# Patient Record
Sex: Female | Born: 1945 | Race: White | Hispanic: No | Marital: Married | Smoking: Never smoker
Health system: Southern US, Community
[De-identification: ages and names within clinical notes are randomized; demographics above are authoritative.]

## PROBLEM LIST (undated history)

## (undated) DIAGNOSIS — J309 Allergic rhinitis, unspecified: Secondary | ICD-10-CM

## (undated) DIAGNOSIS — G4730 Sleep apnea, unspecified: Secondary | ICD-10-CM

## (undated) DIAGNOSIS — J45909 Unspecified asthma, uncomplicated: Secondary | ICD-10-CM

## (undated) DIAGNOSIS — I10 Essential (primary) hypertension: Secondary | ICD-10-CM

## (undated) HISTORY — DX: Allergic rhinitis, unspecified: J30.9

## (undated) HISTORY — DX: Sleep apnea, unspecified: G47.30

## (undated) HISTORY — DX: Unspecified asthma, uncomplicated: J45.909

## (undated) HISTORY — PX: BREAST EXCISIONAL BIOPSY: SUR124

## (undated) HISTORY — DX: Essential (primary) hypertension: I10

## (undated) HISTORY — PX: BREAST SURGERY: SHX581

---

## 1999-04-25 ENCOUNTER — Encounter: Payer: Self-pay | Admitting: *Deleted

## 1999-04-25 ENCOUNTER — Ambulatory Visit (HOSPITAL_COMMUNITY): Admission: RE | Admit: 1999-04-25 | Discharge: 1999-04-25 | Payer: Self-pay | Admitting: *Deleted

## 1999-04-28 ENCOUNTER — Ambulatory Visit (HOSPITAL_COMMUNITY): Admission: RE | Admit: 1999-04-28 | Discharge: 1999-04-28 | Payer: Self-pay | Admitting: *Deleted

## 1999-04-28 ENCOUNTER — Encounter: Payer: Self-pay | Admitting: *Deleted

## 1999-07-04 ENCOUNTER — Emergency Department (HOSPITAL_COMMUNITY): Admission: EM | Admit: 1999-07-04 | Discharge: 1999-07-04 | Payer: Self-pay | Admitting: Emergency Medicine

## 1999-07-04 ENCOUNTER — Encounter: Payer: Self-pay | Admitting: Emergency Medicine

## 1999-07-19 ENCOUNTER — Encounter: Admission: RE | Admit: 1999-07-19 | Discharge: 1999-08-04 | Payer: Self-pay | Admitting: *Deleted

## 1999-09-13 ENCOUNTER — Other Ambulatory Visit: Admission: RE | Admit: 1999-09-13 | Discharge: 1999-09-13 | Payer: Self-pay | Admitting: *Deleted

## 2000-07-01 ENCOUNTER — Encounter: Payer: Self-pay | Admitting: *Deleted

## 2000-07-01 ENCOUNTER — Ambulatory Visit (HOSPITAL_COMMUNITY): Admission: RE | Admit: 2000-07-01 | Discharge: 2000-07-01 | Payer: Self-pay | Admitting: *Deleted

## 2001-08-06 ENCOUNTER — Encounter: Payer: Self-pay | Admitting: *Deleted

## 2001-08-06 ENCOUNTER — Ambulatory Visit (HOSPITAL_COMMUNITY): Admission: RE | Admit: 2001-08-06 | Discharge: 2001-08-06 | Payer: Self-pay | Admitting: *Deleted

## 2002-02-09 ENCOUNTER — Other Ambulatory Visit: Admission: RE | Admit: 2002-02-09 | Discharge: 2002-02-09 | Payer: Self-pay | Admitting: *Deleted

## 2002-04-08 ENCOUNTER — Ambulatory Visit (HOSPITAL_COMMUNITY): Admission: RE | Admit: 2002-04-08 | Discharge: 2002-04-08 | Payer: Self-pay | Admitting: *Deleted

## 2003-04-28 ENCOUNTER — Other Ambulatory Visit: Admission: RE | Admit: 2003-04-28 | Discharge: 2003-04-28 | Payer: Self-pay | Admitting: *Deleted

## 2003-07-08 ENCOUNTER — Ambulatory Visit (HOSPITAL_COMMUNITY): Admission: RE | Admit: 2003-07-08 | Discharge: 2003-07-08 | Payer: Self-pay | Admitting: *Deleted

## 2004-05-29 ENCOUNTER — Other Ambulatory Visit: Admission: RE | Admit: 2004-05-29 | Discharge: 2004-05-29 | Payer: Self-pay | Admitting: *Deleted

## 2004-07-17 ENCOUNTER — Ambulatory Visit (HOSPITAL_COMMUNITY): Admission: RE | Admit: 2004-07-17 | Discharge: 2004-07-17 | Payer: Self-pay | Admitting: *Deleted

## 2005-04-12 ENCOUNTER — Encounter: Admission: RE | Admit: 2005-04-12 | Discharge: 2005-04-12 | Payer: Self-pay | Admitting: *Deleted

## 2005-04-18 ENCOUNTER — Ambulatory Visit (HOSPITAL_COMMUNITY): Admission: RE | Admit: 2005-04-18 | Discharge: 2005-04-18 | Payer: Self-pay | Admitting: General Surgery

## 2005-04-18 ENCOUNTER — Encounter (INDEPENDENT_AMBULATORY_CARE_PROVIDER_SITE_OTHER): Payer: Self-pay | Admitting: Specialist

## 2006-04-16 ENCOUNTER — Ambulatory Visit (HOSPITAL_COMMUNITY): Admission: RE | Admit: 2006-04-16 | Discharge: 2006-04-16 | Payer: Self-pay | Admitting: Obstetrics and Gynecology

## 2006-04-26 ENCOUNTER — Encounter: Admission: RE | Admit: 2006-04-26 | Discharge: 2006-04-26 | Payer: Self-pay | Admitting: Obstetrics and Gynecology

## 2006-07-02 ENCOUNTER — Other Ambulatory Visit: Admission: RE | Admit: 2006-07-02 | Discharge: 2006-07-02 | Payer: Self-pay | Admitting: Obstetrics & Gynecology

## 2006-10-15 ENCOUNTER — Encounter: Admission: RE | Admit: 2006-10-15 | Discharge: 2006-10-15 | Payer: Self-pay | Admitting: Obstetrics & Gynecology

## 2006-12-10 ENCOUNTER — Ambulatory Visit (HOSPITAL_BASED_OUTPATIENT_CLINIC_OR_DEPARTMENT_OTHER): Admission: RE | Admit: 2006-12-10 | Discharge: 2006-12-10 | Payer: Self-pay | Admitting: Otolaryngology

## 2006-12-10 ENCOUNTER — Encounter: Payer: Self-pay | Admitting: Pulmonary Disease

## 2006-12-15 ENCOUNTER — Ambulatory Visit: Payer: Self-pay | Admitting: Internal Medicine

## 2007-01-03 ENCOUNTER — Other Ambulatory Visit: Admission: RE | Admit: 2007-01-03 | Discharge: 2007-01-03 | Payer: Self-pay | Admitting: Obstetrics & Gynecology

## 2007-03-13 ENCOUNTER — Ambulatory Visit (HOSPITAL_COMMUNITY): Admission: RE | Admit: 2007-03-13 | Discharge: 2007-03-14 | Payer: Self-pay | Admitting: Otolaryngology

## 2007-03-13 ENCOUNTER — Encounter (INDEPENDENT_AMBULATORY_CARE_PROVIDER_SITE_OTHER): Payer: Self-pay | Admitting: Otolaryngology

## 2007-05-08 ENCOUNTER — Encounter: Admission: RE | Admit: 2007-05-08 | Discharge: 2007-05-08 | Payer: Self-pay | Admitting: Obstetrics & Gynecology

## 2007-06-16 ENCOUNTER — Ambulatory Visit: Payer: Self-pay | Admitting: Pulmonary Disease

## 2007-07-04 ENCOUNTER — Other Ambulatory Visit: Admission: RE | Admit: 2007-07-04 | Discharge: 2007-07-04 | Payer: Self-pay | Admitting: Obstetrics & Gynecology

## 2007-07-14 ENCOUNTER — Ambulatory Visit: Payer: Self-pay | Admitting: Pulmonary Disease

## 2007-08-04 ENCOUNTER — Encounter: Payer: Self-pay | Admitting: Pulmonary Disease

## 2008-03-01 DIAGNOSIS — J302 Other seasonal allergic rhinitis: Secondary | ICD-10-CM | POA: Insufficient documentation

## 2008-03-01 DIAGNOSIS — I10 Essential (primary) hypertension: Secondary | ICD-10-CM | POA: Insufficient documentation

## 2008-03-01 DIAGNOSIS — J3089 Other allergic rhinitis: Secondary | ICD-10-CM

## 2008-07-08 ENCOUNTER — Other Ambulatory Visit: Admission: RE | Admit: 2008-07-08 | Discharge: 2008-07-08 | Payer: Self-pay | Admitting: Obstetrics & Gynecology

## 2008-09-01 ENCOUNTER — Encounter: Admission: RE | Admit: 2008-09-01 | Discharge: 2008-09-01 | Payer: Self-pay | Admitting: Obstetrics & Gynecology

## 2008-09-13 ENCOUNTER — Ambulatory Visit: Payer: Self-pay | Admitting: Pulmonary Disease

## 2008-09-13 DIAGNOSIS — G4733 Obstructive sleep apnea (adult) (pediatric): Secondary | ICD-10-CM

## 2008-09-17 ENCOUNTER — Telehealth: Payer: Self-pay | Admitting: Pulmonary Disease

## 2009-09-08 ENCOUNTER — Encounter: Admission: RE | Admit: 2009-09-08 | Discharge: 2009-09-08 | Payer: Self-pay | Admitting: Obstetrics & Gynecology

## 2010-09-13 ENCOUNTER — Encounter
Admission: RE | Admit: 2010-09-13 | Discharge: 2010-09-13 | Payer: Self-pay | Source: Home / Self Care | Attending: Obstetrics & Gynecology | Admitting: Obstetrics & Gynecology

## 2010-09-17 ENCOUNTER — Encounter: Payer: Self-pay | Admitting: Obstetrics and Gynecology

## 2010-09-17 ENCOUNTER — Encounter: Payer: Self-pay | Admitting: Obstetrics & Gynecology

## 2011-01-09 NOTE — H&P (Signed)
NAME:  GETHSEMANE, FISCHLER NO.:  [id number]   MEDICAL RECORD NO.:  [id number]          PATIENT TYPE:  OIB   LOCATION:  5733                         FACILITY:  [facility]   PHYSICIAN:  Hermelinda Medicus, M.D.   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  She has gained some weight.  She weighs slightly over 200  pounds and she has had a sleep study which has shown her oxygen level,  her O2 nadir, to drop to 75%.  She started as a normal saturation of 93%  on room air.  She also has an RDI of 82.1.  She weighs 214 pounds.  BMI  of 37.8.  She is also an allergy patient.  Her allergies are moderately  under control and she has a normal chest x-ray.  She also has had some  sinus issues, where she has had a CAT scan of her sinuses which showed  the sinuses to just show a little bit of mucous membrane thickening, but  the septal deviation was severe with severe spurring blocking her nose.  At this point it was elected to do just a septal reconstruction,  turbinate reduction.  She may try a dental prosthesis, she may also try  CPAP; but neither one of these would work well until the septum is  straightened so that she can get possibility for CPAP.  She now enters  for a septal reconstruction and turbinate reduction.   PAST MEDICAL HISTORY:  Her past history is one of blood pressure  elevation, but has been relatively quite normal.  She more recently has  had a blood pressure of 143/92, however; but she has been taking  decongestants, Maxifed-G and also a half of an Allegra D12.  The Maxifed-  G is in the a.m.  She also takes hydrochlorothiazide 25 mg; Allegra D,  as I said, one-half at bedtime; Prometrium 100 mg; multivitamins;  calcium citrate; Slow  Iron; vitamin C; glucosamine XS; and fish oil.  She has no allergies to medications, and otherwise she is in good  health.  She does work with MRSA patients but has been cultured and  found to be free of any abnormal bacteria.  Her EKG was also normal, and  her lab work was also within normal limits.   PHYSICAL EXAMINATION:  GENERAL APPEARANCE:  Her physical examination  reveals a well-nourished, well-developed, mildly overweight female.  VITAL SIGNS:  Blood pressure of 143/92, the pulse is 78.  EARS:  The ears are clear.  The tympanic membranes are clear.  MOUTH:  The  oral cavity is clear, fairly small, and with a low palate.  NOSE:  The septum is grossly deviated to the left, in the columella, in  the vomerine spur and the ethmoid region actually swings somewhat in the  high ethmoid back to the right.  Her turbinates are also  hypertrophied.  She shows no polyps, however.  THROAT:  The larynx has clear true cords, false cords, epiglottal space  and tongue are clear of ulceration or mass.  True cord mobility, gag  reflex, tongue mobility, EOMs and facial nerve are all symmetrical.  NECK:  The neck is free of any thyromegaly, cervical adenopathy or mass.  No salivary gland abnormalities.   INITIAL DIAGNOSIS:  The initial diagnosis is that of sleep apnea with  septal deviation with turbinate hypertrophy with a history of blood  pressure elevation with history of moderate obesity.           ______________________________  Hermelinda Medicus, M.D.     JC/MEDQ  D:  03/13/2007  T:  03/13/2007  Job:  [id number]   cc:   Chales Salmon. Abigail Miyamoto, M.D.

## 2011-01-09 NOTE — Op Note (Signed)
NAME:  [name] Finley, [name] Finley            ACCOUNT NO.:  [id number]   MEDICAL RECORD NO.:  [id number]          PATIENT TYPE:  OIB   LOCATION:  5733                         FACILITY:  [facility]   PHYSICIAN:  Hermelinda Medicus, M.D.   DATE OF BIRTH:  12-25-1945   DATE OF PROCEDURE:  DATE OF DISCHARGE:                               OPERATIVE REPORT   PREOPERATIVE DIAGNOSES:  1. Septal deviation.  2. Turbinate hypertrophy.  3. Nasal obstruction.  4. History of sleep apnea.   POSTOPERATIVE DIAGNOSES:  1. Septal deviation.  2. Turbinate hypertrophy.  3. Nasal obstruction.  4. History of sleep apnea.   OPERATION:  Septal reconstruction, turbinate reduction.   OPERATOR:  Hermelinda Medicus, M.D.   ANESTHESIA:  Local monitored anesthesia care.   PROCEDURE:  The patient was placed in the supine position.  Under local  MAC anesthesia she was anesthetized with 1% Xylocaine with epinephrine  and topical cocaine 200 mg.  The 1% Xylocaine with epinephrine was using  8 mL and she is aware of the risks and gains, aware that she will  possibly need CPAP after this as she does have sleep apnea and we will  be keeping her overnight for 24-hour observation and using anesthesia  trumpets postoperatively to guarantee her airway because of her sleep  apnea history.  She is aware of the risks of infection and of any  bleeding and is to be very careful and not travel over the next few  days.   Once the anesthesia was instilled, a hemitransfixion incision was made  on the right side carried around the columella to the left side and the  mucoperichondrium was elevated along that side back along the  quadrilateral cartilage, and the floor of the nose was just filled with  the deviated quadrilateral cartilage off to the premaxillary crest.  This was taken using the freer elevator and then we worked back towards  the vomerine which was similarly taken to the left and we took a very  large spur off the floor of the nose  as well as the vomerine septum  using the open and closed Jansen-Middletons and the 4-mm chisel and  worked our away right back to the anterior wall of the sphenoid sinus.  Then our attention was taken to the ethmoid which swung to the right  causing some blockage on that side and using the open and closed Leane Para-  Middletons and the Augusta forceps we removed a small amount of  ethmoid septal deviation that was off to the right.  The columella was  reestablished on the midline and once this was completed, the inferior  turbinates were all fractured, the middle turbinates were infractured  and they were concha bullosa also on the right side which was corrected  using the polyp forceps, but no mucous membrane was removed.  The AllMed  was then set at 12 and this was used to coagulate or cauterize the  inferior lateral aspect of the turbinates.  Once this was completed,  Telfa was placed within the nose on each side and anesthesia trumpet #7  was placed on the left  side to #1 ensure her airway, and #2 add extra  pressure to push that septum towards the right side of the nose.   Her follow up will be in the hospital overnight because of her sleep  apnea history and then will be in 3 days, then in 2 weeks, 4 weeks, 6  weeks, 3 months, and 6 months.  She may have a sleep study repeated.  She may used just a dental appliance to try to help her sleep apnea if  this septal surgery does not resolve it adequately or we may go to CPAP  which then would be an option. It is not an option before this severe  septal deviation was corrected.           ______________________________  Hermelinda Medicus, M.D.     JC/MEDQ  D:  03/13/2007  T:  03/13/2007  Job:  [id number]   cc:   Chales Salmon. Abigail Miyamoto, M.D.

## 2011-01-09 NOTE — Assessment & Plan Note (Signed)
[facility]ALTHCARE                             PULMONARY OFFICE NOTE   NAME:FLOTKOETTERGrainne, [name]                   MRN:          [id number]  DATE:06/16/2007                            DOB:          03-11-1946    HISTORY OF PRESENT ILLNESS:  The patient is a 65 year old female who I  have been asked to see for medical management of obstructive sleep  apnea.  The patient was diagnosed with severe obstructive sleep apnea in  April of 2008 where she was found to have an apnea/hypopnea index of 82  events per hour, and O2 desaturation as low as 75%.  The patient was  scheduled for nasal septal reconstruction with UP3 by Dr. Haroldine Laws,  however, there were some insurance issues, and she just had the nasal  septal reconstruction that would help her be able to tolerate CPAP  better.  The patient states that she typically gets to bed between 1 and  2 a.m. after working a 3 to 11 shift.  She gets up between 8 and 8:30 to  start her day.  She has been noted to have loud snoring, but no one has  ever mentioned pauses in her breathing during sleep.  She is not rested  in the mornings whenever she arises, and does have chronic a.m.  headaches.  The patient works as a Engineer, civil (consulting) at [employer], and  feels tired all of the time.  She has sleep pressure with inactivity,  and also decreased alertness, efficiency, and concentration.  She has  had dozing issues with TV or movies in the evenings.  She has no  difficulties driving short distances, but does not trust herself to  drive long distances because of sleepiness.  Her weight has increased  about 15 pounds over the last few years.   PAST MEDICAL HISTORY:  1. Hypertension.  2. Allergic rhinitis.  3. History of sleep apnea as stated above.  4. History of nasal septal reconstruction.   CURRENT MEDICATIONS:  1. Allegra D 1/2 q.a.m.  2. Hydrochlorothiazide 25 mg daily.  3. Glucosamine 1500 mg daily.  4. Multivitamin  daily.  5. Ambien 5 mg nightly p.r.n.  6. Over-the-counter vitamins and herbal preparations.   The patient has no known drug allergies.   SOCIAL HISTORY:  She is married.  She has never smoked.   FAMILY HISTORY:  Remarkable for her mother having polymyalgia  rheumatica, otherwise unremarkable.   REVIEW OF SYSTEMS:  As per history of present illness.  Also, see the  patient intake form documented in the chart.   PHYSICAL EXAMINATION:  IN GENERAL:  She is an obese female in no acute  distress.  Blood pressure is 122/82, pulse 67, temperature is 97.8.  Weight is 199  pounds.  She is 5 feet 3 inches tall.  O2 saturation on room air is 98%.  HEENT:  Pupils are equal, round, and reactive to light and  accommodation, extraocular muscles are intact.  Nares show mild septal  deviation to the left.  Oropharynx shows moderate elongation of the soft  palate and uvula.  NECK:  Supple without JVD or lymphadenopathy.  There is no palpable  thyromegaly.  CHEST:  Totally clear.  CARDIAC EXAM:  Reveals regular rate and rhythm, no murmurs, rubs, or  gallops.  ABDOMEN:  Soft, nontender, with good bowel sounds.  GENITAL/RECTAL/BREAST EXAM:  Not done and not indicated.  LOWER EXTREMITIES:  Without edema.  Pulses are intact distally.  Neurologically alert and oriented with no obvious observable motor  defects.   IMPRESSION:  History of severe obstructive sleep apnea, documented by  nocturnal polysomnography.  The patient clearly is very symptomatic  during the day, and it has had a significant impact on her quality of  life.  I really think at this point in time continuous positive airway  pressure coupled with weight loss would be her best treatment option.  The patient is agreeable to giving this a try.   PLAN:  1. Initiate continuous positive airway pressure at 10 cm.  2. Work on weight loss.  3. The patient will follow up in 4 weeks or sooner if there are      problems.  At that point in  time, we can work on pressure      optimization.     Barbaraann Share, MD,FCCP  Electronically Signed    [facility]/MedQ  DD: 06/16/2007  DT: 06/17/2007  Job #: [id number]   cc:   Hermelinda Medicus, M.D.  Chales Salmon. Abigail Miyamoto, M.D.

## 2011-01-12 NOTE — Op Note (Signed)
NAME:  [name] Finley, [name] Finley            ACCOUNT NO.:  [id number]   MEDICAL RECORD NO.:  [id number]          PATIENT TYPE:  AMB   LOCATION:  DAY                          FACILITY:  [facility]   PHYSICIAN:  Ollen Gross. Vernell Morgans, M.D. DATE OF BIRTH:  1946-07-28   DATE OF PROCEDURE:  04/18/2005  DATE OF DISCHARGE:                                 OPERATIVE REPORT   PREOPERATIVE DIAGNOSIS:  Left breast mass.   POSTOPERATIVE DIAGNOSIS:  Left breast mass.   PROCEDURE:  Left breast biopsy.   SURGEON:  Ollen Gross. Carolynne Edouard, M.D.   ANESTHESIA:  General via LMA.   PROCEDURE:  After informed consent was obtained, the patient was brought to  the operating room and placed in the supine positron on the operating room  table.  After adequate induction of general anesthesia, the patient's left  breast was prepped with Betadine and draped in the usual sterile manner.  The mass in question was palpable at 12 o'clock in the left breast.  A small  curvilinear incision was made with a 15 blade knife overlying the incision.  This incision was carried down through the skin and into the subcutaneous  fatty tissue of the breast sharply with the electrocautery.  The mass was  then grasped with Allis clamp, and dissection was carried out sharply with  the electrocautery around the mass until the mass was completely removed  from the breast.  Once the mass was removed, it was oriented with a short  stitch superiorly and long stitch laterally, and double stitch in the skin.  The cavity was palpated, and no other masses were appreciated.  Hemostasis  was achieved using the Bovie electrocautery.  The wound was then infiltrated  with 0.25% Marcaine with epinephrine.  The wound was irrigated with copious  amounts of saline, and the skin was closed with interrupted 4-0 Monocryl  subcuticular stitches.  Benzoin, Steri-Strips, and sterile dressings were  applied.  The patient tolerated the procedure well.  At the end of the case,  all  needle, sponge, and instrument counts were correct.  The patient was  then awakened and taken to the recovery room in stable condition.      Ollen Gross. Vernell Morgans, M.D.  Electronically Signed     PST/MEDQ  D:  04/18/2005  T:  04/18/2005  Job:  [id number]

## 2011-01-12 NOTE — Procedures (Signed)
NAME:  [name] Finley, [name] Finley            ACCOUNT NO.:  [id number]   MEDICAL RECORD NO.:  [id number]          PATIENT TYPE:  OUT   LOCATION:  SLEEP CENTER                 FACILITY:  [facility]   PHYSICIAN:  Clinton D. Maple Hudson, MD, FCCP, FACPDATE OF BIRTH:  [birth year]   DATE OF STUDY:  12/10/2006                            NOCTURNAL POLYSOMNOGRAM   REFERRING PHYSICIAN:  Hermelinda Medicus, M.D.   INDICATION FOR STUDY:  Hypersomnia with sleep apnea.   EPWORTH SLEEPINESS SCORE:  19/24. BMI 37.8, weight 214 pounds.   MEDICATIONS:  Home medications are listed and reviewed.   SLEEP ARCHITECTURE:  Total sleep time 328 minutes with sleep efficiency  88%.  Stage I was 5%, stage II 65%, stages III and IV 13%. REM 16% of  total sleep time. Sleep latency 4 minutes, REM latency 135 minutes,  awake after sleep onset 41 minutes, arousal index 21.4.  Ambien 5 mg was  taken at 10:50 p.m.   RESPIRATORY DATA:  NPSG diagnostic protocol as ordered.  Apnea/hypopnea  index (AHI, RDI) 82.1 obstructive events per hour indicating severe  obstructive sleep apnea/hypopnea syndrome.  There were 348 obstructive  apnea's, 101 hypopnea's.  Events were not positional. REM AHI 104 per  hour.   OXYGEN DATA:  Loud snoring in all positions with oxygen desaturation to  a nadir of 75%.  Mean oxygen saturation through the study was 93% on  room air.   CARDIAC DATA:  Normal sinus rhythm.   MOVEMENT-PARASOMNIA:  Occasional limb jerk with little effect on sleep.   IMPRESSIONS-RECOMMENDATIONS:  1. Severe obstructive sleep apnea/hypopnea syndrome, AHI 82.1 per hour      with non positional events, loud snoring and oxygen desaturation to      75%.  2. The patient reported difficulty breathing through her nose.   Consider return for CPAP titration or evaluate for alternative therapies  as appropriate.      Clinton D. Maple Hudson, MD, [facility], FACP  Diplomate, Biomedical engineer of Sleep Medicine  Electronically Signed     CDY/MEDQ  D:   12/15/2006 13:54:38  T:  12/15/2006 18:40:21  Job:  [id number]

## 2011-06-11 LAB — URINE MICROSCOPIC-ADD ON

## 2011-06-11 LAB — CBC
HCT: 38.8
Hemoglobin: 13.3
MCHC: 34.2
MCV: 91.7
Platelets: 287
RBC: 4.23
RDW: 14
WBC: 8.8

## 2011-06-11 LAB — URINALYSIS, ROUTINE W REFLEX MICROSCOPIC
Bilirubin Urine: NEGATIVE
Glucose, UA: NEGATIVE
Ketones, ur: NEGATIVE
Nitrite: NEGATIVE
Protein, ur: NEGATIVE
Specific Gravity, Urine: 1.016
Urobilinogen, UA: 0.2
pH: 6

## 2011-06-11 LAB — BASIC METABOLIC PANEL WITH GFR
BUN: 13
CO2: 28
Calcium: 9.5
GFR calc Af Amer: >60
GFR calc non Af Amer: >60
Potassium: 3.9

## 2011-06-11 LAB — BASIC METABOLIC PANEL
Chloride: 106
Creatinine, Ser: 0.68
Glucose, Bld: 102 — ABNORMAL HIGH
Sodium: 141

## 2011-06-13 ENCOUNTER — Telehealth: Payer: Self-pay | Admitting: Pulmonary Disease

## 2011-06-13 NOTE — Telephone Encounter (Signed)
[id number] x1 

## 2011-06-14 NOTE — Telephone Encounter (Signed)
Spoke with pt. She states that she has already had her nickel testing done and nothing further needed.

## 2011-09-05 DIAGNOSIS — R262 Difficulty in walking, not elsewhere classified: Secondary | ICD-10-CM | POA: Diagnosis not present

## 2011-09-05 DIAGNOSIS — M6281 Muscle weakness (generalized): Secondary | ICD-10-CM | POA: Diagnosis not present

## 2011-09-05 DIAGNOSIS — IMO0001 Reserved for inherently not codable concepts without codable children: Secondary | ICD-10-CM | POA: Diagnosis not present

## 2011-09-11 DIAGNOSIS — R262 Difficulty in walking, not elsewhere classified: Secondary | ICD-10-CM | POA: Diagnosis not present

## 2011-09-11 DIAGNOSIS — M6281 Muscle weakness (generalized): Secondary | ICD-10-CM | POA: Diagnosis not present

## 2011-09-11 DIAGNOSIS — IMO0001 Reserved for inherently not codable concepts without codable children: Secondary | ICD-10-CM | POA: Diagnosis not present

## 2011-09-12 ENCOUNTER — Other Ambulatory Visit: Payer: Self-pay | Admitting: Obstetrics & Gynecology

## 2011-09-12 DIAGNOSIS — Z1231 Encounter for screening mammogram for malignant neoplasm of breast: Secondary | ICD-10-CM

## 2011-09-13 DIAGNOSIS — IMO0001 Reserved for inherently not codable concepts without codable children: Secondary | ICD-10-CM | POA: Diagnosis not present

## 2011-09-13 DIAGNOSIS — R262 Difficulty in walking, not elsewhere classified: Secondary | ICD-10-CM | POA: Diagnosis not present

## 2011-09-13 DIAGNOSIS — M6281 Muscle weakness (generalized): Secondary | ICD-10-CM | POA: Diagnosis not present

## 2011-09-18 DIAGNOSIS — M6281 Muscle weakness (generalized): Secondary | ICD-10-CM | POA: Diagnosis not present

## 2011-09-18 DIAGNOSIS — IMO0001 Reserved for inherently not codable concepts without codable children: Secondary | ICD-10-CM | POA: Diagnosis not present

## 2011-09-18 DIAGNOSIS — R262 Difficulty in walking, not elsewhere classified: Secondary | ICD-10-CM | POA: Diagnosis not present

## 2011-09-20 DIAGNOSIS — M6281 Muscle weakness (generalized): Secondary | ICD-10-CM | POA: Diagnosis not present

## 2011-09-20 DIAGNOSIS — IMO0001 Reserved for inherently not codable concepts without codable children: Secondary | ICD-10-CM | POA: Diagnosis not present

## 2011-09-20 DIAGNOSIS — R262 Difficulty in walking, not elsewhere classified: Secondary | ICD-10-CM | POA: Diagnosis not present

## 2011-09-24 DIAGNOSIS — IMO0001 Reserved for inherently not codable concepts without codable children: Secondary | ICD-10-CM | POA: Diagnosis not present

## 2011-09-24 DIAGNOSIS — M6281 Muscle weakness (generalized): Secondary | ICD-10-CM | POA: Diagnosis not present

## 2011-09-24 DIAGNOSIS — R262 Difficulty in walking, not elsewhere classified: Secondary | ICD-10-CM | POA: Diagnosis not present

## 2011-09-27 DIAGNOSIS — R05 Cough: Secondary | ICD-10-CM | POA: Diagnosis not present

## 2011-09-27 DIAGNOSIS — J209 Acute bronchitis, unspecified: Secondary | ICD-10-CM | POA: Diagnosis not present

## 2011-09-27 DIAGNOSIS — R059 Cough, unspecified: Secondary | ICD-10-CM | POA: Diagnosis not present

## 2011-09-28 ENCOUNTER — Ambulatory Visit: Payer: Self-pay

## 2011-10-01 DIAGNOSIS — M25569 Pain in unspecified knee: Secondary | ICD-10-CM | POA: Diagnosis not present

## 2011-10-01 DIAGNOSIS — M1710 Unilateral primary osteoarthritis, unspecified knee: Secondary | ICD-10-CM | POA: Diagnosis not present

## 2011-10-02 DIAGNOSIS — IMO0001 Reserved for inherently not codable concepts without codable children: Secondary | ICD-10-CM | POA: Diagnosis not present

## 2011-10-02 DIAGNOSIS — R262 Difficulty in walking, not elsewhere classified: Secondary | ICD-10-CM | POA: Diagnosis not present

## 2011-10-02 DIAGNOSIS — M6281 Muscle weakness (generalized): Secondary | ICD-10-CM | POA: Diagnosis not present

## 2011-10-04 DIAGNOSIS — H269 Unspecified cataract: Secondary | ICD-10-CM | POA: Diagnosis not present

## 2011-10-05 ENCOUNTER — Ambulatory Visit: Payer: Self-pay

## 2011-10-08 ENCOUNTER — Ambulatory Visit
Admission: RE | Admit: 2011-10-08 | Discharge: 2011-10-08 | Disposition: A | Discharge status: Home / Self Care | Payer: Medicare Other | Source: Ambulatory Visit | Attending: Obstetrics & Gynecology | Admitting: Obstetrics & Gynecology

## 2011-10-08 DIAGNOSIS — Z1231 Encounter for screening mammogram for malignant neoplasm of breast: Secondary | ICD-10-CM | POA: Diagnosis not present

## 2011-10-09 DIAGNOSIS — IMO0001 Reserved for inherently not codable concepts without codable children: Secondary | ICD-10-CM | POA: Diagnosis not present

## 2011-10-09 DIAGNOSIS — R262 Difficulty in walking, not elsewhere classified: Secondary | ICD-10-CM | POA: Diagnosis not present

## 2011-10-09 DIAGNOSIS — M6281 Muscle weakness (generalized): Secondary | ICD-10-CM | POA: Diagnosis not present

## 2011-10-11 DIAGNOSIS — IMO0001 Reserved for inherently not codable concepts without codable children: Secondary | ICD-10-CM | POA: Diagnosis not present

## 2011-10-11 DIAGNOSIS — M6281 Muscle weakness (generalized): Secondary | ICD-10-CM | POA: Diagnosis not present

## 2011-10-11 DIAGNOSIS — R262 Difficulty in walking, not elsewhere classified: Secondary | ICD-10-CM | POA: Diagnosis not present

## 2011-10-16 DIAGNOSIS — IMO0001 Reserved for inherently not codable concepts without codable children: Secondary | ICD-10-CM | POA: Diagnosis not present

## 2011-10-16 DIAGNOSIS — M6281 Muscle weakness (generalized): Secondary | ICD-10-CM | POA: Diagnosis not present

## 2011-10-16 DIAGNOSIS — R262 Difficulty in walking, not elsewhere classified: Secondary | ICD-10-CM | POA: Diagnosis not present

## 2011-10-23 DIAGNOSIS — M6281 Muscle weakness (generalized): Secondary | ICD-10-CM | POA: Diagnosis not present

## 2011-10-23 DIAGNOSIS — IMO0001 Reserved for inherently not codable concepts without codable children: Secondary | ICD-10-CM | POA: Diagnosis not present

## 2011-10-23 DIAGNOSIS — R262 Difficulty in walking, not elsewhere classified: Secondary | ICD-10-CM | POA: Diagnosis not present

## 2011-10-29 DIAGNOSIS — K219 Gastro-esophageal reflux disease without esophagitis: Secondary | ICD-10-CM | POA: Diagnosis not present

## 2011-10-29 DIAGNOSIS — I10 Essential (primary) hypertension: Secondary | ICD-10-CM | POA: Diagnosis not present

## 2011-10-29 DIAGNOSIS — J209 Acute bronchitis, unspecified: Secondary | ICD-10-CM | POA: Diagnosis not present

## 2011-10-30 DIAGNOSIS — R262 Difficulty in walking, not elsewhere classified: Secondary | ICD-10-CM | POA: Diagnosis not present

## 2011-10-30 DIAGNOSIS — IMO0001 Reserved for inherently not codable concepts without codable children: Secondary | ICD-10-CM | POA: Diagnosis not present

## 2011-10-30 DIAGNOSIS — M6281 Muscle weakness (generalized): Secondary | ICD-10-CM | POA: Diagnosis not present

## 2011-11-07 DIAGNOSIS — M6281 Muscle weakness (generalized): Secondary | ICD-10-CM | POA: Diagnosis not present

## 2011-11-07 DIAGNOSIS — R262 Difficulty in walking, not elsewhere classified: Secondary | ICD-10-CM | POA: Diagnosis not present

## 2011-11-07 DIAGNOSIS — IMO0001 Reserved for inherently not codable concepts without codable children: Secondary | ICD-10-CM | POA: Diagnosis not present

## 2011-11-13 DIAGNOSIS — IMO0001 Reserved for inherently not codable concepts without codable children: Secondary | ICD-10-CM | POA: Diagnosis not present

## 2011-11-13 DIAGNOSIS — M6281 Muscle weakness (generalized): Secondary | ICD-10-CM | POA: Diagnosis not present

## 2011-11-13 DIAGNOSIS — R262 Difficulty in walking, not elsewhere classified: Secondary | ICD-10-CM | POA: Diagnosis not present

## 2011-11-19 DIAGNOSIS — M1710 Unilateral primary osteoarthritis, unspecified knee: Secondary | ICD-10-CM | POA: Diagnosis not present

## 2011-11-19 DIAGNOSIS — M25569 Pain in unspecified knee: Secondary | ICD-10-CM | POA: Diagnosis not present

## 2011-12-11 DIAGNOSIS — H538 Other visual disturbances: Secondary | ICD-10-CM | POA: Diagnosis not present

## 2012-01-09 DIAGNOSIS — J0100 Acute maxillary sinusitis, unspecified: Secondary | ICD-10-CM | POA: Diagnosis not present

## 2012-01-22 DIAGNOSIS — J0100 Acute maxillary sinusitis, unspecified: Secondary | ICD-10-CM | POA: Diagnosis not present

## 2012-01-25 DIAGNOSIS — H2510 Age-related nuclear cataract, unspecified eye: Secondary | ICD-10-CM | POA: Diagnosis not present

## 2012-01-25 DIAGNOSIS — H02839 Dermatochalasis of unspecified eye, unspecified eyelid: Secondary | ICD-10-CM | POA: Diagnosis not present

## 2012-04-10 DIAGNOSIS — H2510 Age-related nuclear cataract, unspecified eye: Secondary | ICD-10-CM | POA: Diagnosis not present

## 2012-04-22 DIAGNOSIS — K219 Gastro-esophageal reflux disease without esophagitis: Secondary | ICD-10-CM | POA: Diagnosis not present

## 2012-04-22 DIAGNOSIS — K625 Hemorrhage of anus and rectum: Secondary | ICD-10-CM | POA: Diagnosis not present

## 2012-04-22 DIAGNOSIS — G4700 Insomnia, unspecified: Secondary | ICD-10-CM | POA: Diagnosis not present

## 2012-04-30 DIAGNOSIS — M2550 Pain in unspecified joint: Secondary | ICD-10-CM | POA: Diagnosis not present

## 2012-04-30 DIAGNOSIS — D649 Anemia, unspecified: Secondary | ICD-10-CM | POA: Diagnosis not present

## 2012-04-30 DIAGNOSIS — K625 Hemorrhage of anus and rectum: Secondary | ICD-10-CM | POA: Diagnosis not present

## 2012-04-30 DIAGNOSIS — G479 Sleep disorder, unspecified: Secondary | ICD-10-CM | POA: Diagnosis not present

## 2012-04-30 DIAGNOSIS — I10 Essential (primary) hypertension: Secondary | ICD-10-CM | POA: Diagnosis not present

## 2012-04-30 DIAGNOSIS — J309 Allergic rhinitis, unspecified: Secondary | ICD-10-CM | POA: Diagnosis not present

## 2012-04-30 DIAGNOSIS — Z23 Encounter for immunization: Secondary | ICD-10-CM | POA: Diagnosis not present

## 2012-04-30 DIAGNOSIS — K219 Gastro-esophageal reflux disease without esophagitis: Secondary | ICD-10-CM | POA: Diagnosis not present

## 2012-05-02 ENCOUNTER — Ambulatory Visit (INDEPENDENT_AMBULATORY_CARE_PROVIDER_SITE_OTHER): Payer: Medicare Other | Admitting: Pulmonary Disease

## 2012-05-02 ENCOUNTER — Encounter: Payer: Self-pay | Admitting: Pulmonary Disease

## 2012-05-02 VITALS — BP 132/76 | HR 73 | Temp 98.5°F | Ht 63.0 in | Wt 199.2 lb

## 2012-05-02 DIAGNOSIS — G4733 Obstructive sleep apnea (adult) (pediatric): Secondary | ICD-10-CM

## 2012-05-02 NOTE — Progress Notes (Signed)
  Subjective:    Patient ID: [name] Finley, female    DOB: 1946-04-21, 66 y.o.   MRN: [id number]  HPI The patient is a 66 year old female who I've been asked to see for management of obstructive sleep apnea.  She was diagnosed with severe sleep apnea in 2008, with an AHI of 82 events per hour.  She was started on CPAP, but has not been seen since 2010.  She has been staying compliant with CPAP, but feels that she is due for a new machine because of mechanical issues.  She has been keeping up with her mask changes and supplies.  The patient feels rested most mornings, however when she does not it is usually because of a late bedtime.  She feels that her alertness during the day is normal, and denies any sleepiness issues at night with watching television or reading.  She denies any sleepiness issues with driving.  Her weight is up about 3 pounds from the last visit, and her Epworth score today is normal.  Sleep Questionnaire: What time do you typically go to bed?( Between what hours) 1-2AM How long does it take you to fall asleep? 1/2 -1 1/2 PT IS A RN CHARGE NURSE How many times during the night do you wake up? 0 What time do you get out of bed to start your day? 0930 Do you drive or operate heavy machinery in your occupation? No How much has your weight changed (up or down) over the past two years? (In pounds) 10 lb (4.536 kg) Have you ever had a sleep study before? Yes If yes, location of study? [facility] If yes, date of study? 11-2006 Do you currently use CPAP? Yes If so, what pressure? NOT SURE Do you wear oxygen at any time? No    Review of Systems  Constitutional: Negative for fever and unexpected weight change.  HENT: Positive for congestion, rhinorrhea, postnasal drip and sinus pressure. Negative for ear pain, nosebleeds, sore throat, sneezing, trouble swallowing and dental problem.   Eyes: Negative for redness and itching.  Respiratory: Positive for cough, shortness of breath and wheezing.  Negative for chest tightness.   Cardiovascular: Negative for palpitations and leg swelling.  Gastrointestinal: Negative for nausea and vomiting.  Genitourinary: Negative for dysuria.  Musculoskeletal: Positive for joint swelling.  Skin: Negative for rash.  Neurological: Positive for headaches.  Hematological: Bruises/bleeds easily.  Psychiatric/Behavioral: Negative for dysphoric mood. The patient is nervous/anxious.        Objective:   Physical Exam Constitutional:  Obese female, no acute distress  HENT:  Nares patent without discharge, swollen turbinates.   Oropharynx without exudate, palate and uvula are mildly abnormal   Eyes:  Perrla, eomi, no scleral icterus  Neck:  No JVD, no TMG  Cardiovascular:  Normal rate, regular rhythm, no rubs or gallops.  No murmurs        Intact distal pulses  Pulmonary :  Normal breath sounds, no stridor or respiratory distress   No rales, rhonchi, or wheezing  Abdominal:  Soft, nondistended, bowel sounds present.  No tenderness noted.   Musculoskeletal:  Minimal lower extremity edema noted.  Lymph Nodes:  No cervical lymphadenopathy noted  Skin:  No cyanosis noted  Neurologic:  Alert, appropriate, moves all 4 extremities without obvious deficit.         Assessment & Plan:

## 2012-05-02 NOTE — Patient Instructions (Addendum)
Will send an order to your equipment provider for a new machine if you qualify Work on weight loss followup with me in one year.

## 2012-05-02 NOTE — Assessment & Plan Note (Signed)
The patient has a history of very severe obstructive sleep apnea, and has been compliant with CPAP.  She is apparently having issues with her current machine, and may be eligible for a new one.  Overall, she feels that she sleeps fairly well, and denies any significant daytime sleepiness unless she has had a short total sleep time the night before.  I have encouraged her to continue with CPAP, keep up with supplies and mask changes, and to work aggressively on weight loss.  I will need to see her on a yearly basis and as needed.

## 2012-05-05 DIAGNOSIS — H269 Unspecified cataract: Secondary | ICD-10-CM | POA: Diagnosis not present

## 2012-05-05 DIAGNOSIS — H2510 Age-related nuclear cataract, unspecified eye: Secondary | ICD-10-CM | POA: Diagnosis not present

## 2012-05-05 DIAGNOSIS — H52209 Unspecified astigmatism, unspecified eye: Secondary | ICD-10-CM | POA: Diagnosis not present

## 2012-05-08 DIAGNOSIS — R198 Other specified symptoms and signs involving the digestive system and abdomen: Secondary | ICD-10-CM | POA: Diagnosis not present

## 2012-05-08 DIAGNOSIS — K625 Hemorrhage of anus and rectum: Secondary | ICD-10-CM | POA: Diagnosis not present

## 2012-05-08 DIAGNOSIS — R12 Heartburn: Secondary | ICD-10-CM | POA: Diagnosis not present

## 2012-05-08 DIAGNOSIS — K219 Gastro-esophageal reflux disease without esophagitis: Secondary | ICD-10-CM | POA: Diagnosis not present

## 2012-05-15 ENCOUNTER — Other Ambulatory Visit: Payer: Self-pay | Admitting: Gastroenterology

## 2012-05-15 DIAGNOSIS — K626 Ulcer of anus and rectum: Secondary | ICD-10-CM | POA: Diagnosis not present

## 2012-05-15 DIAGNOSIS — K449 Diaphragmatic hernia without obstruction or gangrene: Secondary | ICD-10-CM | POA: Diagnosis not present

## 2012-05-15 DIAGNOSIS — K625 Hemorrhage of anus and rectum: Secondary | ICD-10-CM | POA: Diagnosis not present

## 2012-05-15 DIAGNOSIS — K6289 Other specified diseases of anus and rectum: Secondary | ICD-10-CM | POA: Diagnosis not present

## 2012-05-15 DIAGNOSIS — R12 Heartburn: Secondary | ICD-10-CM | POA: Diagnosis not present

## 2012-05-15 DIAGNOSIS — D126 Benign neoplasm of colon, unspecified: Secondary | ICD-10-CM | POA: Diagnosis not present

## 2012-05-15 DIAGNOSIS — K5730 Diverticulosis of large intestine without perforation or abscess without bleeding: Secondary | ICD-10-CM | POA: Diagnosis not present

## 2012-05-15 DIAGNOSIS — K2100 Gastro-esophageal reflux disease with esophagitis, without bleeding: Secondary | ICD-10-CM | POA: Diagnosis not present

## 2012-05-19 DIAGNOSIS — H269 Unspecified cataract: Secondary | ICD-10-CM | POA: Diagnosis not present

## 2012-05-19 DIAGNOSIS — H2510 Age-related nuclear cataract, unspecified eye: Secondary | ICD-10-CM | POA: Diagnosis not present

## 2012-05-19 DIAGNOSIS — H52209 Unspecified astigmatism, unspecified eye: Secondary | ICD-10-CM | POA: Diagnosis not present

## 2012-06-03 DIAGNOSIS — K2100 Gastro-esophageal reflux disease with esophagitis, without bleeding: Secondary | ICD-10-CM | POA: Diagnosis not present

## 2012-06-03 DIAGNOSIS — K6289 Other specified diseases of anus and rectum: Secondary | ICD-10-CM | POA: Diagnosis not present

## 2012-07-16 DIAGNOSIS — R21 Rash and other nonspecific skin eruption: Secondary | ICD-10-CM | POA: Diagnosis not present

## 2012-07-29 DIAGNOSIS — R21 Rash and other nonspecific skin eruption: Secondary | ICD-10-CM | POA: Diagnosis not present

## 2012-07-29 DIAGNOSIS — J309 Allergic rhinitis, unspecified: Secondary | ICD-10-CM | POA: Diagnosis not present

## 2012-07-29 DIAGNOSIS — J329 Chronic sinusitis, unspecified: Secondary | ICD-10-CM | POA: Diagnosis not present

## 2012-08-07 DIAGNOSIS — H1045 Other chronic allergic conjunctivitis: Secondary | ICD-10-CM | POA: Diagnosis not present

## 2012-08-08 DIAGNOSIS — M7750 Other enthesopathy of unspecified foot: Secondary | ICD-10-CM | POA: Diagnosis not present

## 2012-08-08 DIAGNOSIS — M722 Plantar fascial fibromatosis: Secondary | ICD-10-CM | POA: Diagnosis not present

## 2012-08-08 DIAGNOSIS — Z820 Family history of epilepsy and other diseases of the nervous system: Secondary | ICD-10-CM | POA: Diagnosis not present

## 2012-09-08 ENCOUNTER — Other Ambulatory Visit: Payer: Self-pay | Admitting: Family Medicine

## 2012-09-08 ENCOUNTER — Other Ambulatory Visit: Payer: Self-pay | Admitting: Obstetrics & Gynecology

## 2012-09-08 ENCOUNTER — Other Ambulatory Visit (HOSPITAL_COMMUNITY): Payer: Self-pay | Admitting: Family Medicine

## 2012-09-08 DIAGNOSIS — Z1231 Encounter for screening mammogram for malignant neoplasm of breast: Secondary | ICD-10-CM

## 2012-10-09 ENCOUNTER — Ambulatory Visit: Payer: Medicare Other

## 2012-10-15 ENCOUNTER — Ambulatory Visit: Payer: Medicare Other

## 2012-10-17 DIAGNOSIS — J019 Acute sinusitis, unspecified: Secondary | ICD-10-CM | POA: Diagnosis not present

## 2012-10-29 DIAGNOSIS — M2550 Pain in unspecified joint: Secondary | ICD-10-CM | POA: Diagnosis not present

## 2012-10-29 DIAGNOSIS — G479 Sleep disorder, unspecified: Secondary | ICD-10-CM | POA: Diagnosis not present

## 2012-10-29 DIAGNOSIS — I10 Essential (primary) hypertension: Secondary | ICD-10-CM | POA: Diagnosis not present

## 2012-10-29 DIAGNOSIS — E669 Obesity, unspecified: Secondary | ICD-10-CM | POA: Diagnosis not present

## 2012-10-29 DIAGNOSIS — D649 Anemia, unspecified: Secondary | ICD-10-CM | POA: Diagnosis not present

## 2012-10-29 DIAGNOSIS — M722 Plantar fascial fibromatosis: Secondary | ICD-10-CM | POA: Diagnosis not present

## 2012-10-29 DIAGNOSIS — K219 Gastro-esophageal reflux disease without esophagitis: Secondary | ICD-10-CM | POA: Diagnosis not present

## 2012-10-29 DIAGNOSIS — L723 Sebaceous cyst: Secondary | ICD-10-CM | POA: Diagnosis not present

## 2012-10-29 DIAGNOSIS — Z23 Encounter for immunization: Secondary | ICD-10-CM | POA: Diagnosis not present

## 2012-11-13 ENCOUNTER — Ambulatory Visit
Admission: RE | Admit: 2012-11-13 | Discharge: 2012-11-13 | Disposition: A | Discharge status: Home / Self Care | Payer: Medicare Other | Source: Ambulatory Visit | Attending: Family Medicine | Admitting: Family Medicine

## 2012-11-13 DIAGNOSIS — Z1231 Encounter for screening mammogram for malignant neoplasm of breast: Secondary | ICD-10-CM

## 2012-12-18 DIAGNOSIS — K219 Gastro-esophageal reflux disease without esophagitis: Secondary | ICD-10-CM | POA: Diagnosis not present

## 2012-12-18 DIAGNOSIS — K6289 Other specified diseases of anus and rectum: Secondary | ICD-10-CM | POA: Diagnosis not present

## 2012-12-18 DIAGNOSIS — K2100 Gastro-esophageal reflux disease with esophagitis, without bleeding: Secondary | ICD-10-CM | POA: Diagnosis not present

## 2013-03-04 DIAGNOSIS — M76899 Other specified enthesopathies of unspecified lower limb, excluding foot: Secondary | ICD-10-CM | POA: Diagnosis not present

## 2013-05-01 DIAGNOSIS — Z23 Encounter for immunization: Secondary | ICD-10-CM | POA: Diagnosis not present

## 2013-05-01 DIAGNOSIS — D485 Neoplasm of uncertain behavior of skin: Secondary | ICD-10-CM | POA: Diagnosis not present

## 2013-05-01 DIAGNOSIS — I10 Essential (primary) hypertension: Secondary | ICD-10-CM | POA: Diagnosis not present

## 2013-05-01 DIAGNOSIS — R5381 Other malaise: Secondary | ICD-10-CM | POA: Diagnosis not present

## 2013-05-01 DIAGNOSIS — G479 Sleep disorder, unspecified: Secondary | ICD-10-CM | POA: Diagnosis not present

## 2013-05-04 ENCOUNTER — Ambulatory Visit (INDEPENDENT_AMBULATORY_CARE_PROVIDER_SITE_OTHER): Payer: Medicare Other | Admitting: Pulmonary Disease

## 2013-05-04 ENCOUNTER — Encounter: Payer: Self-pay | Admitting: Pulmonary Disease

## 2013-05-04 VITALS — BP 128/82 | HR 60 | Temp 97.3°F | Wt 199.0 lb

## 2013-05-04 DIAGNOSIS — G4733 Obstructive sleep apnea (adult) (pediatric): Secondary | ICD-10-CM

## 2013-05-04 NOTE — Patient Instructions (Addendum)
Continue with cpap, and keep up with your supplies. Work on weight loss. followup with me in one year if doing well.

## 2013-05-04 NOTE — Progress Notes (Signed)
  Subjective:    Patient ID: [name] Finley, female    DOB: [birth year], 67 y.o.   MRN: [id number]  HPI The patient comes in today for followup of her obstructive sleep apnea.  She is wearing CPAP compliantly, and is having no issues with her mask fit or pressure.  She is currently satisfied with her sleep as well as her daytime alertness while wearing the device.  Her weight is stable from the last visit one year ago.   Review of Systems  Constitutional: Negative for fever and unexpected weight change.  HENT: Negative for ear pain, nosebleeds, sore throat, rhinorrhea, sneezing, trouble swallowing, dental problem, postnasal drip and sinus pressure.   Eyes: Negative for redness and itching.  Respiratory: Negative for cough, chest tightness, shortness of breath and wheezing.        Chest congestion  Cardiovascular: Negative for palpitations and leg swelling.  Gastrointestinal: Negative for nausea and vomiting.  Genitourinary: Negative for dysuria.  Musculoskeletal: Negative for joint swelling.  Skin: Negative for rash.  Neurological: Negative for headaches.  Hematological: Does not bruise/bleed easily.  Psychiatric/Behavioral: Positive for dysphoric mood. The patient is nervous/anxious.        Objective:   Physical Exam Ow female in nad Nose without purulence or d/c noted. No skin breakdown or pressure necrosis from the cpap mask Neck without LN or TMG LE with minimal edema, no cyanosis Alert, does not appear sleepy, moves all 4.        Assessment & Plan:

## 2013-05-04 NOTE — Assessment & Plan Note (Signed)
The pt is doing well with cpap, and is satisfied with her sleep and daytime alertness.  I have encouraged her to work on weight reduction, and to keep up with her mask changes and supplies.  She will f/u with me in one year if doing well.

## 2013-05-13 DIAGNOSIS — L820 Inflamed seborrheic keratosis: Secondary | ICD-10-CM | POA: Diagnosis not present

## 2013-05-13 DIAGNOSIS — D485 Neoplasm of uncertain behavior of skin: Secondary | ICD-10-CM | POA: Diagnosis not present

## 2013-05-13 DIAGNOSIS — D2370 Other benign neoplasm of skin of unspecified lower limb, including hip: Secondary | ICD-10-CM | POA: Diagnosis not present

## 2013-05-13 DIAGNOSIS — L723 Sebaceous cyst: Secondary | ICD-10-CM | POA: Diagnosis not present

## 2013-05-27 DIAGNOSIS — D485 Neoplasm of uncertain behavior of skin: Secondary | ICD-10-CM | POA: Diagnosis not present

## 2013-05-27 DIAGNOSIS — L820 Inflamed seborrheic keratosis: Secondary | ICD-10-CM | POA: Diagnosis not present

## 2013-06-16 DIAGNOSIS — M5137 Other intervertebral disc degeneration, lumbosacral region: Secondary | ICD-10-CM | POA: Diagnosis not present

## 2013-06-16 DIAGNOSIS — M5430 Sciatica, unspecified side: Secondary | ICD-10-CM | POA: Diagnosis not present

## 2013-06-16 DIAGNOSIS — M5380 Other specified dorsopathies, site unspecified: Secondary | ICD-10-CM | POA: Diagnosis not present

## 2013-08-04 DIAGNOSIS — H6690 Otitis media, unspecified, unspecified ear: Secondary | ICD-10-CM | POA: Diagnosis not present

## 2013-08-04 DIAGNOSIS — J019 Acute sinusitis, unspecified: Secondary | ICD-10-CM | POA: Diagnosis not present

## 2013-08-10 DIAGNOSIS — L905 Scar conditions and fibrosis of skin: Secondary | ICD-10-CM | POA: Diagnosis not present

## 2013-08-28 DIAGNOSIS — H6980 Other specified disorders of Eustachian tube, unspecified ear: Secondary | ICD-10-CM | POA: Diagnosis not present

## 2013-08-28 DIAGNOSIS — J069 Acute upper respiratory infection, unspecified: Secondary | ICD-10-CM | POA: Diagnosis not present

## 2013-09-15 DIAGNOSIS — L0293 Carbuncle, unspecified: Secondary | ICD-10-CM | POA: Diagnosis not present

## 2013-09-15 DIAGNOSIS — L0292 Furuncle, unspecified: Secondary | ICD-10-CM | POA: Diagnosis not present

## 2013-10-29 DIAGNOSIS — I10 Essential (primary) hypertension: Secondary | ICD-10-CM | POA: Diagnosis not present

## 2013-10-29 DIAGNOSIS — M949 Disorder of cartilage, unspecified: Secondary | ICD-10-CM | POA: Diagnosis not present

## 2013-10-29 DIAGNOSIS — M899 Disorder of bone, unspecified: Secondary | ICD-10-CM | POA: Diagnosis not present

## 2013-10-29 DIAGNOSIS — Z0000 Encounter for general adult medical examination without abnormal findings: Secondary | ICD-10-CM | POA: Diagnosis not present

## 2013-10-29 DIAGNOSIS — Z23 Encounter for immunization: Secondary | ICD-10-CM | POA: Diagnosis not present

## 2013-10-29 DIAGNOSIS — J309 Allergic rhinitis, unspecified: Secondary | ICD-10-CM | POA: Diagnosis not present

## 2013-10-29 DIAGNOSIS — G479 Sleep disorder, unspecified: Secondary | ICD-10-CM | POA: Diagnosis not present

## 2013-10-29 DIAGNOSIS — K219 Gastro-esophageal reflux disease without esophagitis: Secondary | ICD-10-CM | POA: Diagnosis not present

## 2013-11-16 ENCOUNTER — Other Ambulatory Visit: Payer: Self-pay

## 2013-11-16 DIAGNOSIS — Z1231 Encounter for screening mammogram for malignant neoplasm of breast: Secondary | ICD-10-CM

## 2013-12-09 ENCOUNTER — Ambulatory Visit: Payer: Medicare Other

## 2013-12-10 DIAGNOSIS — K219 Gastro-esophageal reflux disease without esophagitis: Secondary | ICD-10-CM | POA: Diagnosis not present

## 2013-12-10 DIAGNOSIS — H524 Presbyopia: Secondary | ICD-10-CM | POA: Diagnosis not present

## 2013-12-10 DIAGNOSIS — H1045 Other chronic allergic conjunctivitis: Secondary | ICD-10-CM | POA: Diagnosis not present

## 2013-12-10 DIAGNOSIS — H52229 Regular astigmatism, unspecified eye: Secondary | ICD-10-CM | POA: Diagnosis not present

## 2013-12-10 DIAGNOSIS — H5231 Anisometropia: Secondary | ICD-10-CM | POA: Diagnosis not present

## 2013-12-10 DIAGNOSIS — K449 Diaphragmatic hernia without obstruction or gangrene: Secondary | ICD-10-CM | POA: Diagnosis not present

## 2013-12-22 ENCOUNTER — Ambulatory Visit: Payer: Medicare Other

## 2014-02-03 DIAGNOSIS — D235 Other benign neoplasm of skin of trunk: Secondary | ICD-10-CM | POA: Diagnosis not present

## 2014-02-03 DIAGNOSIS — L723 Sebaceous cyst: Secondary | ICD-10-CM | POA: Diagnosis not present

## 2014-05-05 DIAGNOSIS — G4700 Insomnia, unspecified: Secondary | ICD-10-CM | POA: Diagnosis not present

## 2014-05-05 DIAGNOSIS — E669 Obesity, unspecified: Secondary | ICD-10-CM | POA: Diagnosis not present

## 2014-05-05 DIAGNOSIS — Z23 Encounter for immunization: Secondary | ICD-10-CM | POA: Diagnosis not present

## 2014-05-05 DIAGNOSIS — I10 Essential (primary) hypertension: Secondary | ICD-10-CM | POA: Diagnosis not present

## 2014-05-10 ENCOUNTER — Encounter (INDEPENDENT_AMBULATORY_CARE_PROVIDER_SITE_OTHER): Payer: Self-pay

## 2014-05-10 ENCOUNTER — Encounter: Payer: Self-pay | Admitting: Pulmonary Disease

## 2014-05-10 ENCOUNTER — Ambulatory Visit (INDEPENDENT_AMBULATORY_CARE_PROVIDER_SITE_OTHER): Payer: Medicare Other | Admitting: Pulmonary Disease

## 2014-05-10 VITALS — BP 120/70 | HR 68 | Temp 98.3°F | Ht 63.0 in | Wt 216.2 lb

## 2014-05-10 DIAGNOSIS — G4733 Obstructive sleep apnea (adult) (pediatric): Secondary | ICD-10-CM

## 2014-05-10 NOTE — Progress Notes (Signed)
   Subjective:    Patient ID: [name] Finley, female    DOB: 1946/07/14, 68 y.o.   MRN: [id number]  HPI The patient comes in today for followup of her known obstructive sleep apnea. She is been wearing CPAP compliantly, and feels that it continues to help her sleep and daytime alertness. However, she continues to have some sleepiness issues during the day, as well as issues with sleep onset which has been going on for years. She takes chronic sleeping medication for this, and doesn't seem to be working as well. She is having some issues with sealing her nasal pillows, and her weight is increased 17 pounds since the last visit.   Review of Systems  Constitutional: Negative for fever and unexpected weight change.  HENT: Negative for congestion, dental problem, ear pain, nosebleeds, postnasal drip, rhinorrhea, sinus pressure, sneezing, sore throat and trouble swallowing.   Eyes: Negative for redness and itching.  Respiratory: Negative for cough, chest tightness, shortness of breath and wheezing.   Cardiovascular: Negative for palpitations and leg swelling.  Gastrointestinal: Negative for nausea and vomiting.  Genitourinary: Negative for dysuria.  Musculoskeletal: Negative for joint swelling.  Skin: Negative for rash.  Neurological: Negative for headaches.  Hematological: Does not bruise/bleed easily.  Psychiatric/Behavioral: Negative for dysphoric mood. The patient is not nervous/anxious.        Objective:   Physical Exam Obese female in no acute distress Nose without purulence or discharge noted Neck without lymphadenopathy or thyromegaly No skin breakdown or pressure necrosis from the CPAP mask Lower extremities with mild edema, no cyanosis Alert and oriented, moves all 4 extremities.       Assessment & Plan:

## 2014-05-10 NOTE — Patient Instructions (Signed)
Continue with cpap, and get your home care company to show you some different masks.  Will get a download off your machine to make sure everything is working properly.  Take your SD card by for download, and call us if you do not hear from me within 1-2 weeks. Work on weight loss. followup with me again in one year.

## 2014-05-10 NOTE — Assessment & Plan Note (Signed)
The patient is wearing CPAP compliantly by her history, but still has nonrestorative sleep and some daytime sleepiness. Part of the issue may be her mask fit, and I've asked her to consider trying a different type of CPAP mask. She has also gained 17 pounds, and her pressure needs have likely increased. We will get a download off her device to try and help trouble shoot some of these issues. Finally, I have asked her to discuss with her primary physician being referred to a behavioral psychologist to consider cognitive behavioral therapy (CBT). This is considered the gold standard for treatment of sleep onset insomnia.

## 2014-05-22 DIAGNOSIS — L259 Unspecified contact dermatitis, unspecified cause: Secondary | ICD-10-CM | POA: Diagnosis not present

## 2014-05-24 DIAGNOSIS — T148 Other injury of unspecified body region: Secondary | ICD-10-CM | POA: Diagnosis not present

## 2014-05-24 DIAGNOSIS — W57XXXA Bitten or stung by nonvenomous insect and other nonvenomous arthropods, initial encounter: Secondary | ICD-10-CM | POA: Diagnosis not present

## 2014-06-14 ENCOUNTER — Telehealth: Payer: Self-pay | Admitting: Pulmonary Disease

## 2014-06-14 NOTE — Telephone Encounter (Signed)
Let pt know that her download shows great compliance past month, averaging 7 hrs a night, no significant mask leak, and great control of her sleep apnea.  I do not think her symptoms are related to her sleep apnea.

## 2014-06-15 NOTE — Telephone Encounter (Signed)
[id number] x1 

## 2014-06-16 NOTE — Telephone Encounter (Signed)
lmomtcb x 2  

## 2014-06-16 NOTE — Telephone Encounter (Signed)
Pt returned call [phone]

## 2014-06-17 NOTE — Telephone Encounter (Signed)
lmtcb x1 w/ spouse 

## 2014-06-18 NOTE — Telephone Encounter (Signed)
I spoke with patient about results and she verbalized understanding and had no questions 

## 2014-08-02 DIAGNOSIS — Y999 Unspecified external cause status: Secondary | ICD-10-CM | POA: Diagnosis not present

## 2014-08-02 DIAGNOSIS — W1831XA Fall on same level due to stepping on an object, initial encounter: Secondary | ICD-10-CM | POA: Diagnosis not present

## 2014-08-02 DIAGNOSIS — S42211A Unspecified displaced fracture of surgical neck of right humerus, initial encounter for closed fracture: Secondary | ICD-10-CM | POA: Diagnosis not present

## 2014-08-02 DIAGNOSIS — S42251A Displaced fracture of greater tuberosity of right humerus, initial encounter for closed fracture: Secondary | ICD-10-CM | POA: Diagnosis not present

## 2014-08-02 DIAGNOSIS — M79601 Pain in right arm: Secondary | ICD-10-CM | POA: Diagnosis not present

## 2014-08-03 DIAGNOSIS — S42251A Displaced fracture of greater tuberosity of right humerus, initial encounter for closed fracture: Secondary | ICD-10-CM | POA: Diagnosis not present

## 2014-08-03 DIAGNOSIS — S42211A Unspecified displaced fracture of surgical neck of right humerus, initial encounter for closed fracture: Secondary | ICD-10-CM | POA: Diagnosis not present

## 2014-08-04 DIAGNOSIS — S42201A Unspecified fracture of upper end of right humerus, initial encounter for closed fracture: Secondary | ICD-10-CM | POA: Diagnosis not present

## 2014-08-24 DIAGNOSIS — S42201D Unspecified fracture of upper end of right humerus, subsequent encounter for fracture with routine healing: Secondary | ICD-10-CM | POA: Diagnosis not present

## 2014-09-27 DIAGNOSIS — M25511 Pain in right shoulder: Secondary | ICD-10-CM | POA: Diagnosis not present

## 2014-09-27 DIAGNOSIS — M25611 Stiffness of right shoulder, not elsewhere classified: Secondary | ICD-10-CM | POA: Diagnosis not present

## 2014-09-27 DIAGNOSIS — M6281 Muscle weakness (generalized): Secondary | ICD-10-CM | POA: Diagnosis not present

## 2014-09-30 ENCOUNTER — Ambulatory Visit
Admission: RE | Admit: 2014-09-30 | Discharge: 2014-09-30 | Disposition: A | Discharge status: Home / Self Care | Payer: Medicare Other | Source: Ambulatory Visit

## 2014-09-30 DIAGNOSIS — Z1231 Encounter for screening mammogram for malignant neoplasm of breast: Secondary | ICD-10-CM | POA: Diagnosis not present

## 2014-10-01 DIAGNOSIS — M25611 Stiffness of right shoulder, not elsewhere classified: Secondary | ICD-10-CM | POA: Diagnosis not present

## 2014-10-01 DIAGNOSIS — M25511 Pain in right shoulder: Secondary | ICD-10-CM | POA: Diagnosis not present

## 2014-10-01 DIAGNOSIS — M6281 Muscle weakness (generalized): Secondary | ICD-10-CM | POA: Diagnosis not present

## 2014-10-04 DIAGNOSIS — M25611 Stiffness of right shoulder, not elsewhere classified: Secondary | ICD-10-CM | POA: Diagnosis not present

## 2014-10-04 DIAGNOSIS — M25511 Pain in right shoulder: Secondary | ICD-10-CM | POA: Diagnosis not present

## 2014-10-04 DIAGNOSIS — M6281 Muscle weakness (generalized): Secondary | ICD-10-CM | POA: Diagnosis not present

## 2014-10-07 DIAGNOSIS — M25511 Pain in right shoulder: Secondary | ICD-10-CM | POA: Diagnosis not present

## 2014-10-07 DIAGNOSIS — M25611 Stiffness of right shoulder, not elsewhere classified: Secondary | ICD-10-CM | POA: Diagnosis not present

## 2014-10-07 DIAGNOSIS — M6281 Muscle weakness (generalized): Secondary | ICD-10-CM | POA: Diagnosis not present

## 2014-10-11 DIAGNOSIS — M25511 Pain in right shoulder: Secondary | ICD-10-CM | POA: Diagnosis not present

## 2014-10-11 DIAGNOSIS — M6281 Muscle weakness (generalized): Secondary | ICD-10-CM | POA: Diagnosis not present

## 2014-10-11 DIAGNOSIS — M25611 Stiffness of right shoulder, not elsewhere classified: Secondary | ICD-10-CM | POA: Diagnosis not present

## 2014-10-12 DIAGNOSIS — K5120 Ulcerative (chronic) proctitis without complications: Secondary | ICD-10-CM | POA: Diagnosis not present

## 2014-10-12 DIAGNOSIS — K219 Gastro-esophageal reflux disease without esophagitis: Secondary | ICD-10-CM | POA: Diagnosis not present

## 2014-10-12 DIAGNOSIS — K5900 Constipation, unspecified: Secondary | ICD-10-CM | POA: Diagnosis not present

## 2014-10-14 DIAGNOSIS — M25511 Pain in right shoulder: Secondary | ICD-10-CM | POA: Diagnosis not present

## 2014-10-14 DIAGNOSIS — M25611 Stiffness of right shoulder, not elsewhere classified: Secondary | ICD-10-CM | POA: Diagnosis not present

## 2014-10-14 DIAGNOSIS — M6281 Muscle weakness (generalized): Secondary | ICD-10-CM | POA: Diagnosis not present

## 2014-10-19 DIAGNOSIS — S42294D Other nondisplaced fracture of upper end of right humerus, subsequent encounter for fracture with routine healing: Secondary | ICD-10-CM | POA: Diagnosis not present

## 2014-10-21 DIAGNOSIS — M25511 Pain in right shoulder: Secondary | ICD-10-CM | POA: Diagnosis not present

## 2014-10-21 DIAGNOSIS — M6281 Muscle weakness (generalized): Secondary | ICD-10-CM | POA: Diagnosis not present

## 2014-10-21 DIAGNOSIS — M25611 Stiffness of right shoulder, not elsewhere classified: Secondary | ICD-10-CM | POA: Diagnosis not present

## 2014-10-28 DIAGNOSIS — M25611 Stiffness of right shoulder, not elsewhere classified: Secondary | ICD-10-CM | POA: Diagnosis not present

## 2014-10-28 DIAGNOSIS — M6281 Muscle weakness (generalized): Secondary | ICD-10-CM | POA: Diagnosis not present

## 2014-10-28 DIAGNOSIS — M25511 Pain in right shoulder: Secondary | ICD-10-CM | POA: Diagnosis not present

## 2014-11-01 DIAGNOSIS — M25611 Stiffness of right shoulder, not elsewhere classified: Secondary | ICD-10-CM | POA: Diagnosis not present

## 2014-11-01 DIAGNOSIS — M6281 Muscle weakness (generalized): Secondary | ICD-10-CM | POA: Diagnosis not present

## 2014-11-01 DIAGNOSIS — M25511 Pain in right shoulder: Secondary | ICD-10-CM | POA: Diagnosis not present

## 2014-11-03 DIAGNOSIS — M859 Disorder of bone density and structure, unspecified: Secondary | ICD-10-CM | POA: Diagnosis not present

## 2014-11-03 DIAGNOSIS — K219 Gastro-esophageal reflux disease without esophagitis: Secondary | ICD-10-CM | POA: Diagnosis not present

## 2014-11-03 DIAGNOSIS — M1990 Unspecified osteoarthritis, unspecified site: Secondary | ICD-10-CM | POA: Diagnosis not present

## 2014-11-03 DIAGNOSIS — J309 Allergic rhinitis, unspecified: Secondary | ICD-10-CM | POA: Diagnosis not present

## 2014-11-03 DIAGNOSIS — Z0000 Encounter for general adult medical examination without abnormal findings: Secondary | ICD-10-CM | POA: Diagnosis not present

## 2014-11-03 DIAGNOSIS — I10 Essential (primary) hypertension: Secondary | ICD-10-CM | POA: Diagnosis not present

## 2014-11-03 DIAGNOSIS — Z6839 Body mass index (BMI) 39.0-39.9, adult: Secondary | ICD-10-CM | POA: Diagnosis not present

## 2014-11-03 DIAGNOSIS — G479 Sleep disorder, unspecified: Secondary | ICD-10-CM | POA: Diagnosis not present

## 2014-11-03 DIAGNOSIS — Z713 Dietary counseling and surveillance: Secondary | ICD-10-CM | POA: Diagnosis not present

## 2014-11-03 DIAGNOSIS — S42309A Unspecified fracture of shaft of humerus, unspecified arm, initial encounter for closed fracture: Secondary | ICD-10-CM | POA: Diagnosis not present

## 2014-11-03 DIAGNOSIS — E6609 Other obesity due to excess calories: Secondary | ICD-10-CM | POA: Diagnosis not present

## 2014-11-05 DIAGNOSIS — M6281 Muscle weakness (generalized): Secondary | ICD-10-CM | POA: Diagnosis not present

## 2014-11-05 DIAGNOSIS — M25611 Stiffness of right shoulder, not elsewhere classified: Secondary | ICD-10-CM | POA: Diagnosis not present

## 2014-11-05 DIAGNOSIS — M25511 Pain in right shoulder: Secondary | ICD-10-CM | POA: Diagnosis not present

## 2014-11-08 DIAGNOSIS — M25611 Stiffness of right shoulder, not elsewhere classified: Secondary | ICD-10-CM | POA: Diagnosis not present

## 2014-11-08 DIAGNOSIS — M25511 Pain in right shoulder: Secondary | ICD-10-CM | POA: Diagnosis not present

## 2014-11-08 DIAGNOSIS — M6281 Muscle weakness (generalized): Secondary | ICD-10-CM | POA: Diagnosis not present

## 2014-11-12 DIAGNOSIS — M25611 Stiffness of right shoulder, not elsewhere classified: Secondary | ICD-10-CM | POA: Diagnosis not present

## 2014-11-12 DIAGNOSIS — M6281 Muscle weakness (generalized): Secondary | ICD-10-CM | POA: Diagnosis not present

## 2014-11-12 DIAGNOSIS — M25511 Pain in right shoulder: Secondary | ICD-10-CM | POA: Diagnosis not present

## 2014-11-15 DIAGNOSIS — M25611 Stiffness of right shoulder, not elsewhere classified: Secondary | ICD-10-CM | POA: Diagnosis not present

## 2014-11-15 DIAGNOSIS — M25511 Pain in right shoulder: Secondary | ICD-10-CM | POA: Diagnosis not present

## 2014-11-15 DIAGNOSIS — M6281 Muscle weakness (generalized): Secondary | ICD-10-CM | POA: Diagnosis not present

## 2014-11-22 DIAGNOSIS — M6281 Muscle weakness (generalized): Secondary | ICD-10-CM | POA: Diagnosis not present

## 2014-11-22 DIAGNOSIS — M25511 Pain in right shoulder: Secondary | ICD-10-CM | POA: Diagnosis not present

## 2014-11-22 DIAGNOSIS — M859 Disorder of bone density and structure, unspecified: Secondary | ICD-10-CM | POA: Diagnosis not present

## 2014-11-22 DIAGNOSIS — M25611 Stiffness of right shoulder, not elsewhere classified: Secondary | ICD-10-CM | POA: Diagnosis not present

## 2014-11-22 DIAGNOSIS — M8580 Other specified disorders of bone density and structure, unspecified site: Secondary | ICD-10-CM | POA: Diagnosis not present

## 2014-11-26 DIAGNOSIS — M25611 Stiffness of right shoulder, not elsewhere classified: Secondary | ICD-10-CM | POA: Diagnosis not present

## 2014-11-26 DIAGNOSIS — M6281 Muscle weakness (generalized): Secondary | ICD-10-CM | POA: Diagnosis not present

## 2014-11-26 DIAGNOSIS — M25511 Pain in right shoulder: Secondary | ICD-10-CM | POA: Diagnosis not present

## 2014-11-29 DIAGNOSIS — M25511 Pain in right shoulder: Secondary | ICD-10-CM | POA: Diagnosis not present

## 2014-11-29 DIAGNOSIS — M6281 Muscle weakness (generalized): Secondary | ICD-10-CM | POA: Diagnosis not present

## 2014-11-29 DIAGNOSIS — M25611 Stiffness of right shoulder, not elsewhere classified: Secondary | ICD-10-CM | POA: Diagnosis not present

## 2014-12-03 DIAGNOSIS — M25511 Pain in right shoulder: Secondary | ICD-10-CM | POA: Diagnosis not present

## 2014-12-03 DIAGNOSIS — M25611 Stiffness of right shoulder, not elsewhere classified: Secondary | ICD-10-CM | POA: Diagnosis not present

## 2014-12-03 DIAGNOSIS — M6281 Muscle weakness (generalized): Secondary | ICD-10-CM | POA: Diagnosis not present

## 2014-12-06 DIAGNOSIS — M25611 Stiffness of right shoulder, not elsewhere classified: Secondary | ICD-10-CM | POA: Diagnosis not present

## 2014-12-06 DIAGNOSIS — M6281 Muscle weakness (generalized): Secondary | ICD-10-CM | POA: Diagnosis not present

## 2014-12-06 DIAGNOSIS — M25511 Pain in right shoulder: Secondary | ICD-10-CM | POA: Diagnosis not present

## 2014-12-10 DIAGNOSIS — M25611 Stiffness of right shoulder, not elsewhere classified: Secondary | ICD-10-CM | POA: Diagnosis not present

## 2014-12-10 DIAGNOSIS — M25511 Pain in right shoulder: Secondary | ICD-10-CM | POA: Diagnosis not present

## 2014-12-10 DIAGNOSIS — M6281 Muscle weakness (generalized): Secondary | ICD-10-CM | POA: Diagnosis not present

## 2014-12-13 DIAGNOSIS — M25511 Pain in right shoulder: Secondary | ICD-10-CM | POA: Diagnosis not present

## 2014-12-13 DIAGNOSIS — M6281 Muscle weakness (generalized): Secondary | ICD-10-CM | POA: Diagnosis not present

## 2014-12-13 DIAGNOSIS — M25611 Stiffness of right shoulder, not elsewhere classified: Secondary | ICD-10-CM | POA: Diagnosis not present

## 2014-12-24 ENCOUNTER — Other Ambulatory Visit: Payer: Self-pay | Admitting: Gastroenterology

## 2014-12-24 DIAGNOSIS — R1084 Generalized abdominal pain: Secondary | ICD-10-CM

## 2014-12-24 DIAGNOSIS — R140 Abdominal distension (gaseous): Secondary | ICD-10-CM

## 2014-12-31 ENCOUNTER — Ambulatory Visit
Admission: RE | Admit: 2014-12-31 | Discharge: 2014-12-31 | Disposition: A | Discharge status: Home / Self Care | Payer: Medicare Other | Source: Ambulatory Visit | Attending: Gastroenterology | Admitting: Gastroenterology

## 2014-12-31 DIAGNOSIS — R140 Abdominal distension (gaseous): Secondary | ICD-10-CM

## 2014-12-31 DIAGNOSIS — R1084 Generalized abdominal pain: Secondary | ICD-10-CM

## 2014-12-31 DIAGNOSIS — D259 Leiomyoma of uterus, unspecified: Secondary | ICD-10-CM | POA: Diagnosis not present

## 2015-01-03 ENCOUNTER — Other Ambulatory Visit: Payer: Self-pay | Admitting: Gastroenterology

## 2015-01-03 DIAGNOSIS — R9389 Abnormal findings on diagnostic imaging of other specified body structures: Secondary | ICD-10-CM

## 2015-01-03 DIAGNOSIS — R1084 Generalized abdominal pain: Secondary | ICD-10-CM

## 2015-01-03 DIAGNOSIS — R938 Abnormal findings on diagnostic imaging of other specified body structures: Secondary | ICD-10-CM | POA: Diagnosis not present

## 2015-01-07 ENCOUNTER — Ambulatory Visit
Admission: RE | Admit: 2015-01-07 | Discharge: 2015-01-07 | Disposition: A | Discharge status: Home / Self Care | Payer: Medicare Other | Source: Ambulatory Visit | Attending: Gastroenterology | Admitting: Gastroenterology

## 2015-01-07 DIAGNOSIS — R9389 Abnormal findings on diagnostic imaging of other specified body structures: Secondary | ICD-10-CM

## 2015-01-07 DIAGNOSIS — R1084 Generalized abdominal pain: Secondary | ICD-10-CM | POA: Diagnosis not present

## 2015-01-07 DIAGNOSIS — N281 Cyst of kidney, acquired: Secondary | ICD-10-CM | POA: Diagnosis not present

## 2015-01-07 DIAGNOSIS — D259 Leiomyoma of uterus, unspecified: Secondary | ICD-10-CM | POA: Diagnosis not present

## 2015-01-07 MED ORDER — IOHEXOL 300 MG/ML  SOLN
125.0000 mL | Freq: Once | INTRAMUSCULAR | Status: AC | PRN
  Administered 2015-01-07: 125 mL via INTRAVENOUS

## 2015-01-14 ENCOUNTER — Other Ambulatory Visit: Payer: Self-pay | Admitting: Gastroenterology

## 2015-01-14 DIAGNOSIS — K6389 Other specified diseases of intestine: Secondary | ICD-10-CM | POA: Diagnosis not present

## 2015-01-14 DIAGNOSIS — K5130 Ulcerative (chronic) rectosigmoiditis without complications: Secondary | ICD-10-CM | POA: Diagnosis not present

## 2015-01-14 DIAGNOSIS — D120 Benign neoplasm of cecum: Secondary | ICD-10-CM | POA: Diagnosis not present

## 2015-01-14 DIAGNOSIS — K640 First degree hemorrhoids: Secondary | ICD-10-CM | POA: Diagnosis not present

## 2015-01-14 DIAGNOSIS — K5730 Diverticulosis of large intestine without perforation or abscess without bleeding: Secondary | ICD-10-CM | POA: Diagnosis not present

## 2015-01-14 DIAGNOSIS — K529 Noninfective gastroenteritis and colitis, unspecified: Secondary | ICD-10-CM | POA: Diagnosis not present

## 2015-01-14 DIAGNOSIS — K921 Melena: Secondary | ICD-10-CM | POA: Diagnosis not present

## 2015-01-14 DIAGNOSIS — D126 Benign neoplasm of colon, unspecified: Secondary | ICD-10-CM | POA: Diagnosis not present

## 2015-01-28 DIAGNOSIS — R938 Abnormal findings on diagnostic imaging of other specified body structures: Secondary | ICD-10-CM | POA: Diagnosis not present

## 2015-02-09 DIAGNOSIS — L918 Other hypertrophic disorders of the skin: Secondary | ICD-10-CM | POA: Diagnosis not present

## 2015-02-09 DIAGNOSIS — D235 Other benign neoplasm of skin of trunk: Secondary | ICD-10-CM | POA: Diagnosis not present

## 2015-02-09 DIAGNOSIS — Z1283 Encounter for screening for malignant neoplasm of skin: Secondary | ICD-10-CM | POA: Diagnosis not present

## 2015-02-15 DIAGNOSIS — H5203 Hypermetropia, bilateral: Secondary | ICD-10-CM | POA: Diagnosis not present

## 2015-02-15 DIAGNOSIS — H524 Presbyopia: Secondary | ICD-10-CM | POA: Diagnosis not present

## 2015-02-15 DIAGNOSIS — H04123 Dry eye syndrome of bilateral lacrimal glands: Secondary | ICD-10-CM | POA: Diagnosis not present

## 2015-02-15 DIAGNOSIS — H52223 Regular astigmatism, bilateral: Secondary | ICD-10-CM | POA: Diagnosis not present

## 2015-03-03 DIAGNOSIS — K5130 Ulcerative (chronic) rectosigmoiditis without complications: Secondary | ICD-10-CM | POA: Diagnosis not present

## 2015-05-06 DIAGNOSIS — M859 Disorder of bone density and structure, unspecified: Secondary | ICD-10-CM | POA: Diagnosis not present

## 2015-05-06 DIAGNOSIS — I10 Essential (primary) hypertension: Secondary | ICD-10-CM | POA: Diagnosis not present

## 2015-05-06 DIAGNOSIS — Z23 Encounter for immunization: Secondary | ICD-10-CM | POA: Diagnosis not present

## 2015-05-06 DIAGNOSIS — E6609 Other obesity due to excess calories: Secondary | ICD-10-CM | POA: Diagnosis not present

## 2015-05-06 DIAGNOSIS — G479 Sleep disorder, unspecified: Secondary | ICD-10-CM | POA: Diagnosis not present

## 2015-05-06 DIAGNOSIS — M1990 Unspecified osteoarthritis, unspecified site: Secondary | ICD-10-CM | POA: Diagnosis not present

## 2015-05-11 ENCOUNTER — Ambulatory Visit: Payer: Medicare Other | Admitting: Pulmonary Disease

## 2015-05-24 ENCOUNTER — Institutional Professional Consult (permissible substitution): Payer: Medicare Other | Admitting: Pulmonary Disease

## 2015-08-09 IMAGING — CT CT ABD-PEL WO/W CM
1 of 4 series · 8 of 32 positions shown, 13 images · IV contrast (omnipaque)
Comparison: Abdominal and pelvic ultrasound 12/31/2014.

CLINICAL DATA: Left renal collecting system dilatation on
ultrasound performed for generalized abdominal pain. No current
complaints or history of malignancy. Initial encounter.

EXAM:
CT ABDOMEN AND PELVIS WITHOUT AND WITH CONTRAST
TECHNIQUE: Multidetector CT imaging of the abdomen and pelvis was performed
following the standard protocol before and following the bolus
administration of intravenous contrast.
CONTRAST:  125mL OMNIPAQUE IOHEXOL 300 MG/ML  SOLN

[Series 10: abd pel with 5.0 i41s 3 · axial · 0.83mm/px · z∈[-452,-77]mm · 8 of 97 slices shown, 13 images]
[im 11/97  soft-tissue]
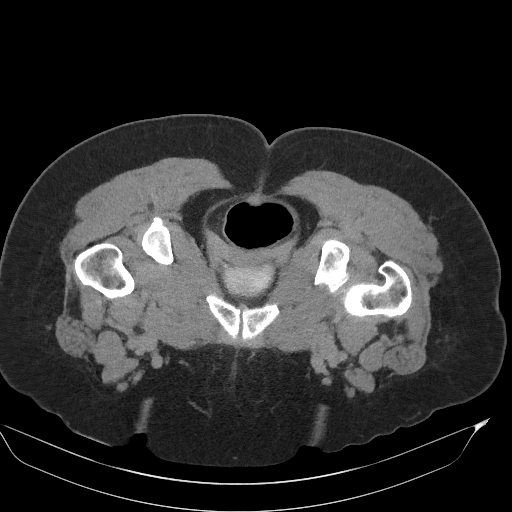
[im 11/97  bone]
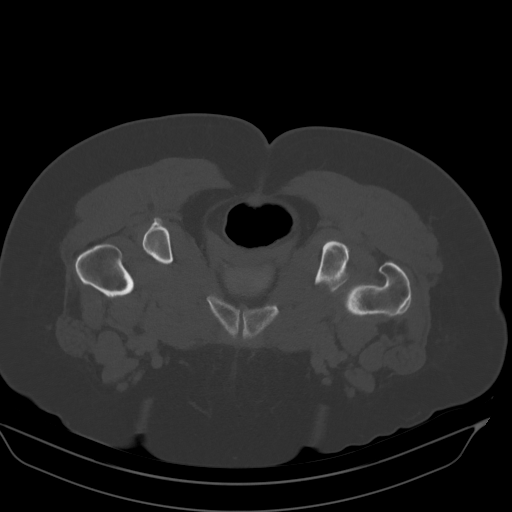
[im 22/97  soft-tissue]
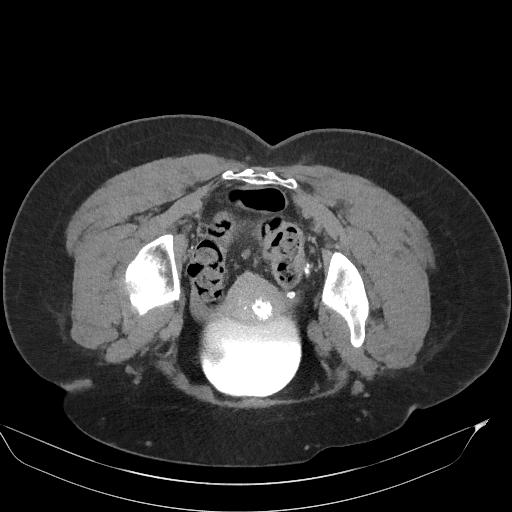
[im 33/97  soft-tissue]
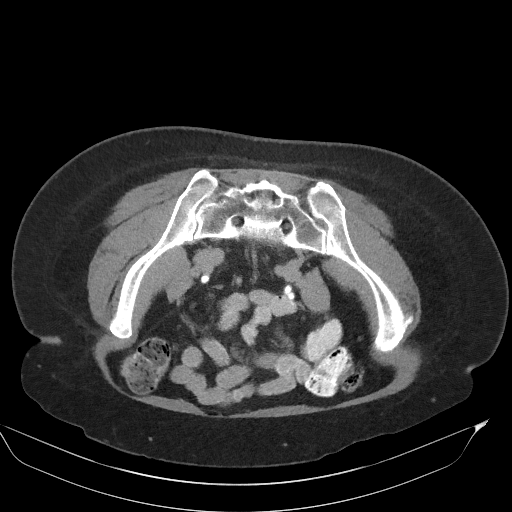
[im 43/97  soft-tissue]
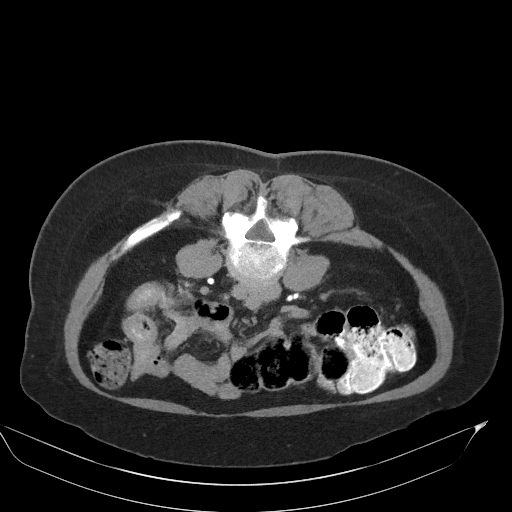
[im 54/97  soft-tissue]
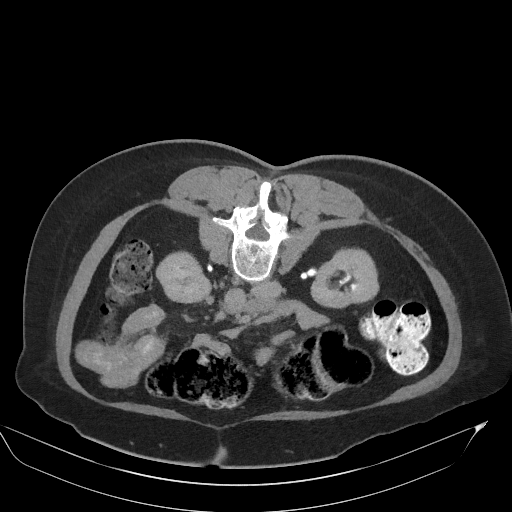
[im 54/97  lung]
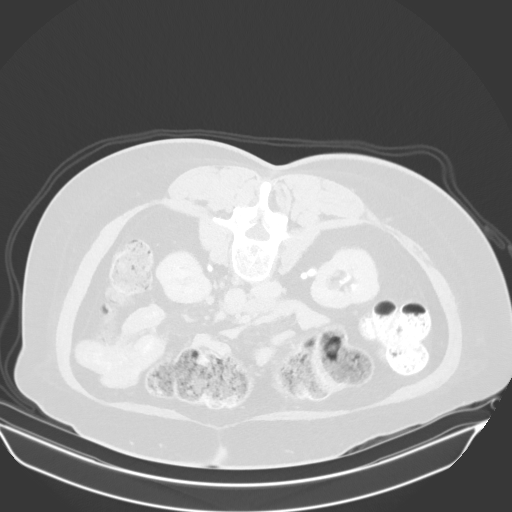
[im 65/97  soft-tissue]
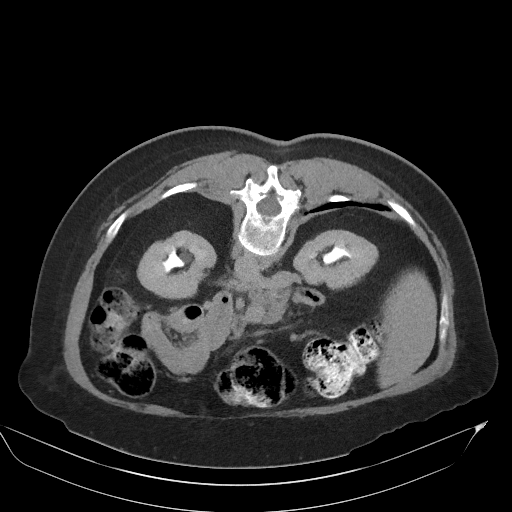
[im 65/97  lung]
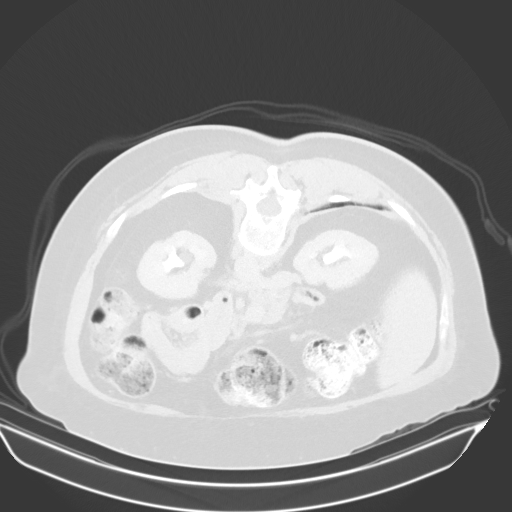
[im 75/97  soft-tissue]
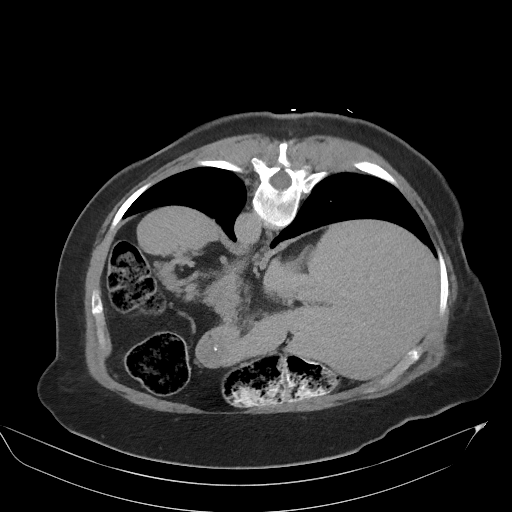
[im 75/97  lung]
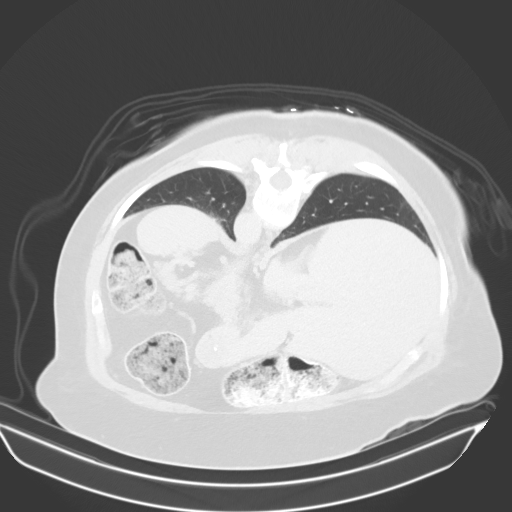
[im 86/97  soft-tissue]
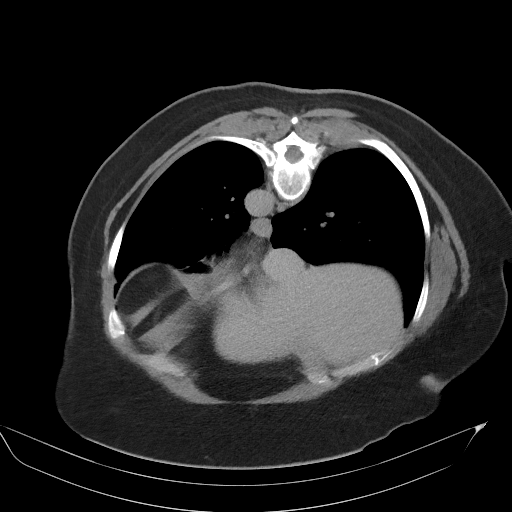
[im 86/97  lung]
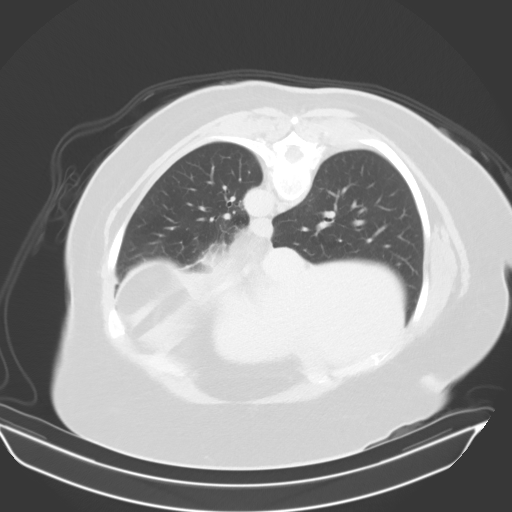

[8 of 32 positions shown; findings below may reference images not displayed]

FINDINGS: Lower chest: Clear lung bases. No significant pleural or pericardial
effusion.

Hepatobiliary: The liver is normal in density without focal
abnormality. No evidence of gallstones, gallbladder wall thickening
or biliary dilatation.

Pancreas: Unremarkable. No pancreatic ductal dilatation or
surrounding inflammatory changes.

Spleen: Normal in size without focal abnormality.

Adrenals/Urinary Tract: Both adrenal glands appear
normal.Pre-contrast images demonstrate no renal, ureteral or bladder
calculi. Post-contrast, both kidneys enhance normally. There is no
evidence of enhancing renal mass. There is a 7 mm cyst in the upper
pole of the right kidney. Both kidneys demonstrate cortical
thinning. Delayed images result in segmental visualization of the
ureters. The right ureter is completely duplicated. There is no
hydronephrosis. There are small renal sinus cysts bilaterally. No
urothelial abnormalities are identified. The bladder appears
unremarkable.

Stomach/Bowel: No evidence of bowel wall thickening, distention or
surrounding inflammatory change.Moderate stool throughout the colon.
There is a small hiatal hernia.

Vascular/Lymphatic: There are no enlarged abdominal or pelvic lymph
nodes. Mild atherosclerosis noted.

Reproductive: As demonstrated on ultrasound, there is a calcified
anterior uterine fundal fibroid. No evidence of adnexal mass.

Other: No evidence of abdominal wall mass or hernia.

Musculoskeletal: No acute or significant osseous findings. Moderate
facet hypertrophy throughout the lower lumbar spine.
IMPRESSION: 1. No evidence of hydronephrosis. Renal sinus cysts bilaterally may
have simulated collecting system dilatation on prior ultrasound.
2. The right ureter is completely duplicated without apparent
ectopic bladder insertion. Both kidneys demonstrate mild cortical
scarring.
3. Moderate stool throughout the colon.
4. Calcified uterine fibroid.

## 2015-08-30 ENCOUNTER — Telehealth: Payer: Self-pay | Admitting: Internal Medicine

## 2015-08-30 ENCOUNTER — Other Ambulatory Visit: Payer: Self-pay

## 2015-08-30 DIAGNOSIS — Z1231 Encounter for screening mammogram for malignant neoplasm of breast: Secondary | ICD-10-CM

## 2015-08-30 NOTE — Telephone Encounter (Signed)
Spoke with pt. States that she does not need refills at this time. Pharmacy has been updated. Nothing further was needed.

## 2015-09-02 DIAGNOSIS — K219 Gastro-esophageal reflux disease without esophagitis: Secondary | ICD-10-CM | POA: Diagnosis not present

## 2015-09-02 DIAGNOSIS — K5130 Ulcerative (chronic) rectosigmoiditis without complications: Secondary | ICD-10-CM | POA: Diagnosis not present

## 2015-09-02 DIAGNOSIS — K625 Hemorrhage of anus and rectum: Secondary | ICD-10-CM | POA: Diagnosis not present

## 2015-09-07 ENCOUNTER — Ambulatory Visit (INDEPENDENT_AMBULATORY_CARE_PROVIDER_SITE_OTHER): Payer: PPO | Admitting: Internal Medicine

## 2015-09-07 ENCOUNTER — Encounter: Payer: Self-pay | Admitting: Internal Medicine

## 2015-09-07 VITALS — BP 126/82 | HR 64 | Ht 62.0 in | Wt 218.2 lb

## 2015-09-07 DIAGNOSIS — G4733 Obstructive sleep apnea (adult) (pediatric): Secondary | ICD-10-CM | POA: Diagnosis not present

## 2015-09-07 DIAGNOSIS — J069 Acute upper respiratory infection, unspecified: Secondary | ICD-10-CM

## 2015-09-07 MED ORDER — BENZONATATE 200 MG PO CAPS: 200.0000 mg | ORAL_CAPSULE | Freq: Three times a day (TID) | ORAL | Status: DC | PRN

## 2015-09-07 MED ORDER — TRAMADOL HCL 50 MG PO TABS: 50.0000 mg | ORAL_TABLET | Freq: Four times a day (QID) | ORAL | Status: AC | PRN

## 2015-09-07 MED ORDER — AZITHROMYCIN 250 MG PO TABS: ORAL_TABLET | ORAL | Status: DC

## 2015-09-07 NOTE — Progress Notes (Signed)
Subjective:    Patient ID: [name] Finley, female    DOB: 1945-09-03, 70 y.o.   MRN: [id number]  HPI 05/10/14- Dr Gwenette Greet The patient comes in today for followup of her known obstructive sleep apnea. She is been wearing CPAP compliantly, and feels that it continues to help her sleep and daytime alertness. However, she continues to have some sleepiness issues during the day, as well as issues with sleep onset which has been going on for years. She takes chronic sleeping medication for this, and doesn't seem to be working as well. She is having some issues with sealing her nasal pillows, and her weight is increased 17 pounds since the last visit.  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 Download confirms excellent control and compliance, used all night every night. She reports definite quality of life improvement and would not want to give CPAP up. No longer snores or has daytime sleepiness. Acute problem-Active respiratory infection over the last few days blowing green now from nose and chest. No fever.  ROS-see HPI   Negative unless "+" Constitutional:    weight loss, night sweats, fevers, chills, fatigue, lassitude. HEENT:    headaches, difficulty swallowing, tooth/dental problems, sore throat,       sneezing, itching, ear ache, + nasal congestion, post nasal drip, snoring CV:    chest pain, orthopnea, PND, swelling in lower extremities, anasarca,                                                 dizziness, palpitations Resp:   shortness of breath with exertion or at rest.                + productive cough,   non-productive cough, coughing up of blood.              + change in color of mucus.  wheezing.   Skin:    rash or lesions. GI:  No-   heartburn,  indigestion, abdominal pain, nausea, vomiting, diarrhea,                 change in bowel habits, loss of appetite GU: dysuria, change in color of urine, no urgency or frequency.   flank pain. MS:   joint pain, stiffness, decreased range of motion, back pain. Neuro-     nothing unusual Psych:  change in mood or affect.  depression or anxiety.   memory loss.  .    Objective:  OBJ- Physical Exam General- Alert, Oriented, Affect-appropriate, Distress- none acute Skin- rash-none, lesions- none, excoriation- none Lymphadenopathy- none Head- atraumatic            Eyes- Gross vision intact, PERRLA, conjunctivae and secretions clear            Ears- Hearing, canals-normal            Nose- Clear, no-Septal dev, mucus, polyps, erosion, perforation             Throat- Mallampati II , mucosa clear , drainage- none, tonsils- atrophic Neck- flexible , trachea midline, no stridor , thyroid nl, carotid no bruit Chest - symmetrical excursion , unlabored  Heart/CV- RRR , no murmur , no gallop  , no rub, nl s1 s2                           - JVD- none , edema- none, stasis changes- none, varices- none           Lung- clear to P&A, wheeze- none, cough- none , dullness-none, rub- none           Chest wall-  Abd-  Br/ Gen/ Rectal- Not done, not indicated Extrem- cyanosis- none, clubbing, none, atrophy- none, strength- nl Neuro- grossly intact to observation    Assessment & Plan:

## 2015-09-07 NOTE — Patient Instructions (Signed)
Renewal order- [facility]-  Continue CPAP 10 cwp, mask of choice, humidifier, supplies, AirView  Dx OSA                                Please instruct how to adjust humidity   You can ask about small travel CPAP devices like Transcend  Scripts for Zpak, benzonatate perles and tramadol( for cough )    Ok to supplement with Delsym cough syrup, throat lozenges, sips of liquids, etc

## 2015-09-07 NOTE — Assessment & Plan Note (Signed)
Improved quality of life. Download confirms excellent compliance and control at CPAP 10/[facility]. We discussed availability of small machines and how to adjust humidifier.

## 2015-09-07 NOTE — Assessment & Plan Note (Signed)
Rhinitis and tracheobronchitis. Sputum has turned purulent over the past week. Plan-Z-Pak, hydration, benzonatate Perles and tramadol to try for cough

## 2015-10-03 ENCOUNTER — Ambulatory Visit
Admission: RE | Admit: 2015-10-03 | Discharge: 2015-10-03 | Disposition: A | Discharge status: Home / Self Care | Payer: PPO | Source: Ambulatory Visit

## 2015-10-03 DIAGNOSIS — Z1231 Encounter for screening mammogram for malignant neoplasm of breast: Secondary | ICD-10-CM

## 2015-10-20 DIAGNOSIS — J209 Acute bronchitis, unspecified: Secondary | ICD-10-CM | POA: Diagnosis not present

## 2015-11-03 DIAGNOSIS — E6609 Other obesity due to excess calories: Secondary | ICD-10-CM | POA: Diagnosis not present

## 2015-11-03 DIAGNOSIS — I10 Essential (primary) hypertension: Secondary | ICD-10-CM | POA: Diagnosis not present

## 2015-11-03 DIAGNOSIS — G479 Sleep disorder, unspecified: Secondary | ICD-10-CM | POA: Diagnosis not present

## 2015-11-03 DIAGNOSIS — K219 Gastro-esophageal reflux disease without esophagitis: Secondary | ICD-10-CM | POA: Diagnosis not present

## 2015-11-03 DIAGNOSIS — M810 Age-related osteoporosis without current pathological fracture: Secondary | ICD-10-CM | POA: Diagnosis not present

## 2015-11-03 DIAGNOSIS — J309 Allergic rhinitis, unspecified: Secondary | ICD-10-CM | POA: Diagnosis not present

## 2015-11-03 DIAGNOSIS — R7301 Impaired fasting glucose: Secondary | ICD-10-CM | POA: Diagnosis not present

## 2016-02-09 DIAGNOSIS — M79672 Pain in left foot: Secondary | ICD-10-CM | POA: Diagnosis not present

## 2016-02-20 DIAGNOSIS — G4733 Obstructive sleep apnea (adult) (pediatric): Secondary | ICD-10-CM | POA: Diagnosis not present

## 2016-03-12 DIAGNOSIS — I10 Essential (primary) hypertension: Secondary | ICD-10-CM | POA: Diagnosis not present

## 2016-03-12 DIAGNOSIS — M79672 Pain in left foot: Secondary | ICD-10-CM | POA: Diagnosis not present

## 2016-03-12 DIAGNOSIS — K5120 Ulcerative (chronic) proctitis without complications: Secondary | ICD-10-CM | POA: Diagnosis not present

## 2016-03-23 DIAGNOSIS — K5130 Ulcerative (chronic) rectosigmoiditis without complications: Secondary | ICD-10-CM | POA: Diagnosis not present

## 2016-04-02 DIAGNOSIS — H43812 Vitreous degeneration, left eye: Secondary | ICD-10-CM | POA: Diagnosis not present

## 2016-04-02 DIAGNOSIS — K219 Gastro-esophageal reflux disease without esophagitis: Secondary | ICD-10-CM | POA: Diagnosis not present

## 2016-04-02 DIAGNOSIS — K5130 Ulcerative (chronic) rectosigmoiditis without complications: Secondary | ICD-10-CM | POA: Diagnosis not present

## 2016-04-02 DIAGNOSIS — H5203 Hypermetropia, bilateral: Secondary | ICD-10-CM | POA: Diagnosis not present

## 2016-04-02 DIAGNOSIS — H52223 Regular astigmatism, bilateral: Secondary | ICD-10-CM | POA: Diagnosis not present

## 2016-05-04 DIAGNOSIS — I10 Essential (primary) hypertension: Secondary | ICD-10-CM | POA: Diagnosis not present

## 2016-05-04 DIAGNOSIS — G479 Sleep disorder, unspecified: Secondary | ICD-10-CM | POA: Diagnosis not present

## 2016-05-04 DIAGNOSIS — Z23 Encounter for immunization: Secondary | ICD-10-CM | POA: Diagnosis not present

## 2016-05-04 DIAGNOSIS — E7800 Pure hypercholesterolemia, unspecified: Secondary | ICD-10-CM | POA: Diagnosis not present

## 2016-05-04 DIAGNOSIS — Z6841 Body Mass Index (BMI) 40.0 and over, adult: Secondary | ICD-10-CM | POA: Diagnosis not present

## 2016-05-04 DIAGNOSIS — E6609 Other obesity due to excess calories: Secondary | ICD-10-CM | POA: Diagnosis not present

## 2016-05-04 DIAGNOSIS — M810 Age-related osteoporosis without current pathological fracture: Secondary | ICD-10-CM | POA: Diagnosis not present

## 2016-06-23 DIAGNOSIS — R05 Cough: Secondary | ICD-10-CM | POA: Diagnosis not present

## 2016-07-11 DIAGNOSIS — M9903 Segmental and somatic dysfunction of lumbar region: Secondary | ICD-10-CM | POA: Diagnosis not present

## 2016-07-11 DIAGNOSIS — M9901 Segmental and somatic dysfunction of cervical region: Secondary | ICD-10-CM | POA: Diagnosis not present

## 2016-07-11 DIAGNOSIS — M5413 Radiculopathy, cervicothoracic region: Secondary | ICD-10-CM | POA: Diagnosis not present

## 2016-07-11 DIAGNOSIS — M545 Low back pain: Secondary | ICD-10-CM | POA: Diagnosis not present

## 2016-07-16 DIAGNOSIS — M5413 Radiculopathy, cervicothoracic region: Secondary | ICD-10-CM | POA: Diagnosis not present

## 2016-07-16 DIAGNOSIS — M9903 Segmental and somatic dysfunction of lumbar region: Secondary | ICD-10-CM | POA: Diagnosis not present

## 2016-07-16 DIAGNOSIS — M545 Low back pain: Secondary | ICD-10-CM | POA: Diagnosis not present

## 2016-07-16 DIAGNOSIS — M9901 Segmental and somatic dysfunction of cervical region: Secondary | ICD-10-CM | POA: Diagnosis not present

## 2016-07-18 DIAGNOSIS — M9903 Segmental and somatic dysfunction of lumbar region: Secondary | ICD-10-CM | POA: Diagnosis not present

## 2016-07-18 DIAGNOSIS — M9901 Segmental and somatic dysfunction of cervical region: Secondary | ICD-10-CM | POA: Diagnosis not present

## 2016-07-18 DIAGNOSIS — M5413 Radiculopathy, cervicothoracic region: Secondary | ICD-10-CM | POA: Diagnosis not present

## 2016-07-18 DIAGNOSIS — M545 Low back pain: Secondary | ICD-10-CM | POA: Diagnosis not present

## 2016-07-23 DIAGNOSIS — M9901 Segmental and somatic dysfunction of cervical region: Secondary | ICD-10-CM | POA: Diagnosis not present

## 2016-07-23 DIAGNOSIS — M545 Low back pain: Secondary | ICD-10-CM | POA: Diagnosis not present

## 2016-07-23 DIAGNOSIS — M9903 Segmental and somatic dysfunction of lumbar region: Secondary | ICD-10-CM | POA: Diagnosis not present

## 2016-07-23 DIAGNOSIS — M5413 Radiculopathy, cervicothoracic region: Secondary | ICD-10-CM | POA: Diagnosis not present

## 2016-07-25 DIAGNOSIS — M545 Low back pain: Secondary | ICD-10-CM | POA: Diagnosis not present

## 2016-07-25 DIAGNOSIS — M5413 Radiculopathy, cervicothoracic region: Secondary | ICD-10-CM | POA: Diagnosis not present

## 2016-07-25 DIAGNOSIS — M9901 Segmental and somatic dysfunction of cervical region: Secondary | ICD-10-CM | POA: Diagnosis not present

## 2016-07-25 DIAGNOSIS — M9903 Segmental and somatic dysfunction of lumbar region: Secondary | ICD-10-CM | POA: Diagnosis not present

## 2016-07-30 DIAGNOSIS — M9903 Segmental and somatic dysfunction of lumbar region: Secondary | ICD-10-CM | POA: Diagnosis not present

## 2016-07-30 DIAGNOSIS — M5413 Radiculopathy, cervicothoracic region: Secondary | ICD-10-CM | POA: Diagnosis not present

## 2016-07-30 DIAGNOSIS — M9901 Segmental and somatic dysfunction of cervical region: Secondary | ICD-10-CM | POA: Diagnosis not present

## 2016-07-30 DIAGNOSIS — M545 Low back pain: Secondary | ICD-10-CM | POA: Diagnosis not present

## 2016-08-02 DIAGNOSIS — M9901 Segmental and somatic dysfunction of cervical region: Secondary | ICD-10-CM | POA: Diagnosis not present

## 2016-08-02 DIAGNOSIS — M9903 Segmental and somatic dysfunction of lumbar region: Secondary | ICD-10-CM | POA: Diagnosis not present

## 2016-08-02 DIAGNOSIS — M545 Low back pain: Secondary | ICD-10-CM | POA: Diagnosis not present

## 2016-08-02 DIAGNOSIS — M5413 Radiculopathy, cervicothoracic region: Secondary | ICD-10-CM | POA: Diagnosis not present

## 2016-08-06 DIAGNOSIS — M9901 Segmental and somatic dysfunction of cervical region: Secondary | ICD-10-CM | POA: Diagnosis not present

## 2016-08-06 DIAGNOSIS — M5413 Radiculopathy, cervicothoracic region: Secondary | ICD-10-CM | POA: Diagnosis not present

## 2016-08-06 DIAGNOSIS — M9903 Segmental and somatic dysfunction of lumbar region: Secondary | ICD-10-CM | POA: Diagnosis not present

## 2016-08-06 DIAGNOSIS — M545 Low back pain: Secondary | ICD-10-CM | POA: Diagnosis not present

## 2016-08-10 DIAGNOSIS — M1711 Unilateral primary osteoarthritis, right knee: Secondary | ICD-10-CM | POA: Diagnosis not present

## 2016-08-10 DIAGNOSIS — M25561 Pain in right knee: Secondary | ICD-10-CM | POA: Diagnosis not present

## 2016-08-13 DIAGNOSIS — M5413 Radiculopathy, cervicothoracic region: Secondary | ICD-10-CM | POA: Diagnosis not present

## 2016-08-13 DIAGNOSIS — M9903 Segmental and somatic dysfunction of lumbar region: Secondary | ICD-10-CM | POA: Diagnosis not present

## 2016-08-13 DIAGNOSIS — M9901 Segmental and somatic dysfunction of cervical region: Secondary | ICD-10-CM | POA: Diagnosis not present

## 2016-08-13 DIAGNOSIS — M545 Low back pain: Secondary | ICD-10-CM | POA: Diagnosis not present

## 2016-08-23 DIAGNOSIS — M5413 Radiculopathy, cervicothoracic region: Secondary | ICD-10-CM | POA: Diagnosis not present

## 2016-08-23 DIAGNOSIS — M9901 Segmental and somatic dysfunction of cervical region: Secondary | ICD-10-CM | POA: Diagnosis not present

## 2016-08-23 DIAGNOSIS — M545 Low back pain: Secondary | ICD-10-CM | POA: Diagnosis not present

## 2016-08-23 DIAGNOSIS — M9903 Segmental and somatic dysfunction of lumbar region: Secondary | ICD-10-CM | POA: Diagnosis not present

## 2016-08-29 DIAGNOSIS — Z1283 Encounter for screening for malignant neoplasm of skin: Secondary | ICD-10-CM | POA: Diagnosis not present

## 2016-08-29 DIAGNOSIS — L821 Other seborrheic keratosis: Secondary | ICD-10-CM | POA: Diagnosis not present

## 2016-08-30 DIAGNOSIS — M545 Low back pain: Secondary | ICD-10-CM | POA: Diagnosis not present

## 2016-08-30 DIAGNOSIS — M9903 Segmental and somatic dysfunction of lumbar region: Secondary | ICD-10-CM | POA: Diagnosis not present

## 2016-09-05 DIAGNOSIS — M1711 Unilateral primary osteoarthritis, right knee: Secondary | ICD-10-CM | POA: Diagnosis not present

## 2016-09-10 DIAGNOSIS — M9903 Segmental and somatic dysfunction of lumbar region: Secondary | ICD-10-CM | POA: Diagnosis not present

## 2016-09-10 DIAGNOSIS — M545 Low back pain: Secondary | ICD-10-CM | POA: Diagnosis not present

## 2016-09-12 ENCOUNTER — Ambulatory Visit: Payer: PPO | Admitting: Internal Medicine

## 2016-09-21 DIAGNOSIS — L309 Dermatitis, unspecified: Secondary | ICD-10-CM | POA: Diagnosis not present

## 2016-09-21 DIAGNOSIS — R05 Cough: Secondary | ICD-10-CM | POA: Diagnosis not present

## 2016-09-21 DIAGNOSIS — B349 Viral infection, unspecified: Secondary | ICD-10-CM | POA: Diagnosis not present

## 2016-09-24 DIAGNOSIS — M545 Low back pain: Secondary | ICD-10-CM | POA: Diagnosis not present

## 2016-09-24 DIAGNOSIS — M9903 Segmental and somatic dysfunction of lumbar region: Secondary | ICD-10-CM | POA: Diagnosis not present

## 2016-10-08 DIAGNOSIS — M545 Low back pain: Secondary | ICD-10-CM | POA: Diagnosis not present

## 2016-10-08 DIAGNOSIS — M9903 Segmental and somatic dysfunction of lumbar region: Secondary | ICD-10-CM | POA: Diagnosis not present

## 2016-10-16 DIAGNOSIS — K5130 Ulcerative (chronic) rectosigmoiditis without complications: Secondary | ICD-10-CM | POA: Diagnosis not present

## 2016-10-22 DIAGNOSIS — M545 Low back pain: Secondary | ICD-10-CM | POA: Diagnosis not present

## 2016-10-22 DIAGNOSIS — M9903 Segmental and somatic dysfunction of lumbar region: Secondary | ICD-10-CM | POA: Diagnosis not present

## 2016-10-29 DIAGNOSIS — G4733 Obstructive sleep apnea (adult) (pediatric): Secondary | ICD-10-CM | POA: Diagnosis not present

## 2016-11-06 DIAGNOSIS — M9903 Segmental and somatic dysfunction of lumbar region: Secondary | ICD-10-CM | POA: Diagnosis not present

## 2016-11-06 DIAGNOSIS — M545 Low back pain: Secondary | ICD-10-CM | POA: Diagnosis not present

## 2016-11-07 ENCOUNTER — Encounter: Payer: Self-pay | Admitting: Internal Medicine

## 2016-11-08 ENCOUNTER — Ambulatory Visit (INDEPENDENT_AMBULATORY_CARE_PROVIDER_SITE_OTHER): Payer: PPO | Admitting: Internal Medicine

## 2016-11-08 ENCOUNTER — Encounter: Payer: Self-pay | Admitting: Internal Medicine

## 2016-11-08 VITALS — BP 104/76 | HR 58 | Ht 62.0 in | Wt 220.6 lb

## 2016-11-08 DIAGNOSIS — J3089 Other allergic rhinitis: Secondary | ICD-10-CM | POA: Diagnosis not present

## 2016-11-08 DIAGNOSIS — J302 Other seasonal allergic rhinitis: Secondary | ICD-10-CM

## 2016-11-08 DIAGNOSIS — G4733 Obstructive sleep apnea (adult) (pediatric): Secondary | ICD-10-CM | POA: Diagnosis not present

## 2016-11-08 MED ORDER — AZELASTINE-FLUTICASONE 137-50 MCG/ACT NA SUSP: 1.0000 | Freq: Two times a day (BID) | NASAL | 0 refills | Status: DC

## 2016-11-08 NOTE — Patient Instructions (Addendum)
Sample Dymista nasal spray    Try 1-2 puffs in each nostril once or twice daily as needed for allergic nose.  If it over-dries, use fewer puffs, less often  We can continue CPAP 10, mask of choice, humidifier, supplies, AirView    Dx OSA  Suggest you make an appointment to see someone at [facility] who can help you look at alternative mask designs that won't irritate your nose. Please call if we can help

## 2016-11-08 NOTE — Progress Notes (Signed)
Subjective:    Patient ID: [name] Finley, female    DOB: 04-19-1946, 71 y.o.   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 Download confirms excellent control and compliance, used all night every night. She reports definite quality of life improvement and would not want to give CPAP up. No longer snores or has daytime sleepiness. Acute problem-Active respiratory infection over the last few days blowing green now from nose and chest. No fever.  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 Discuss "So Clean" cleaning system or other alternatives. Denies problems with pressure. Pt states that she thinks she is getting a bone spur in her left nostril on the septum from previous nasal surgery -- Nasal pillows rubs. Download 100% 4 hour compliance with AHI 3.0/hour  ROS-see HPI   Negative unless "+" Constitutional:    weight loss, night sweats, fevers, chills, fatigue, lassitude. HEENT:    headaches, difficulty swallowing, tooth/dental problems, sore throat,       sneezing, itching, ear ache, + nasal congestion, post nasal drip, snoring CV:    chest pain, orthopnea, PND, swelling in lower extremities, anasarca,                                                 dizziness, palpitations Resp:   shortness of breath with exertion or at rest.                 productive cough,   non-productive  cough, coughing up of blood.               change in color of mucus.  wheezing.   Skin:    rash or lesions. GI:  No-   heartburn, indigestion, abdominal pain, nausea, vomiting, diarrhea,                 change in bowel habits, loss of appetite GU: dysuria, change in color of urine, no urgency or frequency.   flank pain. MS:   joint pain, stiffness, decreased range of motion, back pain. Neuro-     nothing unusual Psych:  change in mood or affect.  depression or anxiety.   memory loss.  .    Objective:  OBJ- Physical Exam General- Alert, Oriented, Affect-appropriate, Distress- none acute Skin- rash-none, lesions- none, excoriation- none Lymphadenopathy- none Head- atraumatic            Eyes- Gross vision intact, PERRLA, conjunctivae and secretions clear            Ears- Hearing, canals-normal            Nose- Clear, no-Septal dev, mucus, polyps, erosion, perforation            Throat- Mallampati II , mucosa clear , drainage- none, tonsils- atrophic Neck- flexible , trachea midline, no stridor , thyroid nl, carotid no bruit Chest - symmetrical  excursion , unlabored           Heart/CV- RRR , no murmur , no gallop  , no rub, nl s1 s2                           - JVD- none , edema- none, stasis changes- none, varices- none           Lung- clear to P&A, wheeze- none, cough- none , dullness-none, rub- none           Chest wall-  Abd-  Br/ Gen/ Rectal- Not done, not indicated Extrem- cyanosis- none, clubbing, none, atrophy- none, strength- nl Neuro- grossly intact to observation    Assessment & Plan:

## 2016-11-12 DIAGNOSIS — M810 Age-related osteoporosis without current pathological fracture: Secondary | ICD-10-CM | POA: Diagnosis not present

## 2016-11-12 DIAGNOSIS — I10 Essential (primary) hypertension: Secondary | ICD-10-CM | POA: Diagnosis not present

## 2016-11-12 DIAGNOSIS — Z6839 Body mass index (BMI) 39.0-39.9, adult: Secondary | ICD-10-CM | POA: Diagnosis not present

## 2016-11-12 DIAGNOSIS — G479 Sleep disorder, unspecified: Secondary | ICD-10-CM | POA: Diagnosis not present

## 2016-11-12 DIAGNOSIS — E6609 Other obesity due to excess calories: Secondary | ICD-10-CM | POA: Diagnosis not present

## 2016-11-12 DIAGNOSIS — E7800 Pure hypercholesterolemia, unspecified: Secondary | ICD-10-CM | POA: Diagnosis not present

## 2016-11-20 DIAGNOSIS — M9903 Segmental and somatic dysfunction of lumbar region: Secondary | ICD-10-CM | POA: Diagnosis not present

## 2016-11-20 DIAGNOSIS — M545 Low back pain: Secondary | ICD-10-CM | POA: Diagnosis not present

## 2016-11-21 ENCOUNTER — Other Ambulatory Visit: Payer: Self-pay | Admitting: Gastroenterology

## 2016-11-21 ENCOUNTER — Other Ambulatory Visit: Payer: Self-pay | Admitting: Family Medicine

## 2016-11-21 DIAGNOSIS — Z1231 Encounter for screening mammogram for malignant neoplasm of breast: Secondary | ICD-10-CM

## 2016-12-17 ENCOUNTER — Ambulatory Visit: Payer: PPO

## 2016-12-17 DIAGNOSIS — M545 Low back pain: Secondary | ICD-10-CM | POA: Diagnosis not present

## 2016-12-17 DIAGNOSIS — M9903 Segmental and somatic dysfunction of lumbar region: Secondary | ICD-10-CM | POA: Diagnosis not present

## 2016-12-18 ENCOUNTER — Ambulatory Visit
Admission: RE | Admit: 2016-12-18 | Discharge: 2016-12-18 | Disposition: A | Discharge status: Home / Self Care | Payer: PPO | Source: Ambulatory Visit | Attending: Family Medicine | Admitting: Family Medicine

## 2016-12-18 DIAGNOSIS — Z1231 Encounter for screening mammogram for malignant neoplasm of breast: Secondary | ICD-10-CM

## 2016-12-21 DIAGNOSIS — M7582 Other shoulder lesions, left shoulder: Secondary | ICD-10-CM | POA: Diagnosis not present

## 2016-12-22 NOTE — Assessment & Plan Note (Signed)
Compliance and control are good fixed CPAP 10. Plan-DME to help her get mask refitting

## 2016-12-22 NOTE — Assessment & Plan Note (Signed)
I don't see a "spur". She is beginning to get some seasonal nasal congestion. Plan-sample Dymista for trial

## 2017-01-14 DIAGNOSIS — M9903 Segmental and somatic dysfunction of lumbar region: Secondary | ICD-10-CM | POA: Diagnosis not present

## 2017-01-14 DIAGNOSIS — M545 Low back pain: Secondary | ICD-10-CM | POA: Diagnosis not present

## 2017-02-11 DIAGNOSIS — M545 Low back pain: Secondary | ICD-10-CM | POA: Diagnosis not present

## 2017-02-11 DIAGNOSIS — M9903 Segmental and somatic dysfunction of lumbar region: Secondary | ICD-10-CM | POA: Diagnosis not present

## 2017-04-18 DIAGNOSIS — K5130 Ulcerative (chronic) rectosigmoiditis without complications: Secondary | ICD-10-CM | POA: Diagnosis not present

## 2017-04-18 DIAGNOSIS — K219 Gastro-esophageal reflux disease without esophagitis: Secondary | ICD-10-CM | POA: Diagnosis not present

## 2017-05-17 DIAGNOSIS — Z23 Encounter for immunization: Secondary | ICD-10-CM | POA: Diagnosis not present

## 2017-05-17 DIAGNOSIS — I10 Essential (primary) hypertension: Secondary | ICD-10-CM | POA: Diagnosis not present

## 2017-05-17 DIAGNOSIS — M810 Age-related osteoporosis without current pathological fracture: Secondary | ICD-10-CM | POA: Diagnosis not present

## 2017-05-17 DIAGNOSIS — R61 Generalized hyperhidrosis: Secondary | ICD-10-CM | POA: Diagnosis not present

## 2017-05-17 DIAGNOSIS — E7800 Pure hypercholesterolemia, unspecified: Secondary | ICD-10-CM | POA: Diagnosis not present

## 2017-05-17 DIAGNOSIS — G479 Sleep disorder, unspecified: Secondary | ICD-10-CM | POA: Diagnosis not present

## 2017-05-17 DIAGNOSIS — M150 Primary generalized (osteo)arthritis: Secondary | ICD-10-CM | POA: Diagnosis not present

## 2017-05-17 DIAGNOSIS — Z6841 Body Mass Index (BMI) 40.0 and over, adult: Secondary | ICD-10-CM | POA: Diagnosis not present

## 2017-07-08 DIAGNOSIS — G4733 Obstructive sleep apnea (adult) (pediatric): Secondary | ICD-10-CM | POA: Diagnosis not present

## 2017-08-09 DIAGNOSIS — K5130 Ulcerative (chronic) rectosigmoiditis without complications: Secondary | ICD-10-CM | POA: Diagnosis not present

## 2017-08-11 DIAGNOSIS — M19012 Primary osteoarthritis, left shoulder: Secondary | ICD-10-CM | POA: Insufficient documentation

## 2017-08-13 DIAGNOSIS — M19012 Primary osteoarthritis, left shoulder: Secondary | ICD-10-CM | POA: Diagnosis not present

## 2017-09-03 DIAGNOSIS — R29898 Other symptoms and signs involving the musculoskeletal system: Secondary | ICD-10-CM | POA: Diagnosis not present

## 2017-09-03 DIAGNOSIS — M25512 Pain in left shoulder: Secondary | ICD-10-CM | POA: Diagnosis not present

## 2017-09-03 DIAGNOSIS — M25612 Stiffness of left shoulder, not elsewhere classified: Secondary | ICD-10-CM | POA: Diagnosis not present

## 2017-09-03 DIAGNOSIS — M19012 Primary osteoarthritis, left shoulder: Secondary | ICD-10-CM | POA: Diagnosis not present

## 2017-09-06 DIAGNOSIS — M25512 Pain in left shoulder: Secondary | ICD-10-CM | POA: Diagnosis not present

## 2017-09-06 DIAGNOSIS — M25612 Stiffness of left shoulder, not elsewhere classified: Secondary | ICD-10-CM | POA: Diagnosis not present

## 2017-09-06 DIAGNOSIS — R29898 Other symptoms and signs involving the musculoskeletal system: Secondary | ICD-10-CM | POA: Diagnosis not present

## 2017-09-10 DIAGNOSIS — R29898 Other symptoms and signs involving the musculoskeletal system: Secondary | ICD-10-CM | POA: Diagnosis not present

## 2017-09-10 DIAGNOSIS — M25512 Pain in left shoulder: Secondary | ICD-10-CM | POA: Diagnosis not present

## 2017-09-10 DIAGNOSIS — M25612 Stiffness of left shoulder, not elsewhere classified: Secondary | ICD-10-CM | POA: Diagnosis not present

## 2017-09-13 ENCOUNTER — Ambulatory Visit: Payer: PPO | Admitting: Podiatry

## 2017-09-13 ENCOUNTER — Other Ambulatory Visit: Payer: Self-pay | Admitting: Podiatry

## 2017-09-13 ENCOUNTER — Ambulatory Visit (INDEPENDENT_AMBULATORY_CARE_PROVIDER_SITE_OTHER): Payer: PPO

## 2017-09-13 ENCOUNTER — Encounter: Payer: Self-pay | Admitting: Podiatry

## 2017-09-13 DIAGNOSIS — L600 Ingrowing nail: Secondary | ICD-10-CM

## 2017-09-13 DIAGNOSIS — M2040 Other hammer toe(s) (acquired), unspecified foot: Secondary | ICD-10-CM

## 2017-09-13 DIAGNOSIS — M25512 Pain in left shoulder: Secondary | ICD-10-CM | POA: Diagnosis not present

## 2017-09-13 DIAGNOSIS — M2041 Other hammer toe(s) (acquired), right foot: Secondary | ICD-10-CM

## 2017-09-13 DIAGNOSIS — M25612 Stiffness of left shoulder, not elsewhere classified: Secondary | ICD-10-CM | POA: Diagnosis not present

## 2017-09-13 DIAGNOSIS — R29898 Other symptoms and signs involving the musculoskeletal system: Secondary | ICD-10-CM | POA: Diagnosis not present

## 2017-09-13 NOTE — Patient Instructions (Addendum)
Soak Instructions    THE DAY AFTER THE PROCEDURE  Place 1/4 cup of epsom salts in a quart of warm tap water.  Submerge your foot or feet with outer bandage intact for the initial soak; this will allow the bandage to become moist and wet for easy lift off.  Once you remove your bandage, continue to soak in the solution for 20 minutes.  This soak should be done twice a day.  Next, remove your foot or feet from solution, blot dry the affected area and cover.  You may use a band aid large enough to cover the area or use gauze and tape.  Apply other medications to the area as directed by the doctor such as polysporin neosporin.  IF YOUR SKIN BECOMES IRRITATED WHILE USING THESE INSTRUCTIONS, IT IS OKAY TO SWITCH TO  WHITE VINEGAR AND WATER. Or you may use antibacterial soap and water to keep the toe clean  Monitor for any signs/symptoms of infection. Call the office immediately if any occur or go directly to the emergency room. Call with any questions/concerns.    Long Term Care Instructions-Post Nail Surgery  You have had your ingrown toenail and root treated with a chemical.  This chemical causes a burn that will drain and ooze like a blister.  This can drain for 6-8 weeks or longer.  It is important to keep this area clean, covered, and follow the soaking instructions dispensed at the time of your surgery.  This area will eventually dry and form a scab.  Once the scab forms you no longer need to soak or apply a dressing.  If at any time you experience an increase in pain, redness, swelling, or drainage, you should contact the office as soon as possible.    Hammer Toe Hammer toe is a change in the shape (a deformity) of your second, third, or fourth toe. The deformity causes the middle joint of your toe to stay bent. This causes pain, especially when you are wearing shoes. Hammer toe starts gradually. At first, the toe can be straightened. Gradually over time, the deformity becomes stiff and  permanent. Early treatments to keep the toe straight may relieve pain. As the deformity becomes stiff and permanent, surgery may be needed to straighten the toe. What are the causes? Hammer toe is caused by abnormal bending of the toe joint that is closest to your foot. It happens gradually over time. This pulls on the muscles and connections (tendons) of the toe joint, making them weak and stiff. It is often related to wearing shoes that are too short or narrow and do not let your toes straighten. What increases the risk? You may be at greater risk for hammer toe if you:  Are female.  Are older.  Wear shoes that are too small.  Wear high-heeled shoes that pinch your toes.  Are a ballet dancer.  Have a second toe that is longer than your big toe (first toe).  Injure your foot or toe.  Have arthritis.  Have a family history of hammer toe.  Have a nerve or muscle disorder.  What are the signs or symptoms? The main symptoms of this condition are pain and deformity of the toe. The pain is worse when wearing shoes, walking, or running. Other symptoms may include:  Corns or calluses over the bent part of the toe or between the toes.  Redness and a burning feeling on the toe.  An open sore that forms on the top of   the toe.  Not being able to straighten the toe.  How is this diagnosed? This condition is diagnosed based on your symptoms and a physical exam. During the exam, your health care provider will try to straighten your toe to see how stiff the deformity is. You may also have tests, such as:  A blood test to check for rheumatoid arthritis.  An X-ray to show how severe the deformity is.  How is this treated? Treatment for this condition will depend on how stiff the deformity is. Surgery is often needed. However, sometimes a hammer toe can be straightened without surgery. Treatments that do not involve surgery include:  Taping the toe into a straightened position.  Using  pads and cushions to protect the toe (orthotics).  Wearing shoes that provide enough room for the toes.  Doing toe-stretching exercises at home.  Taking an NSAID to reduce pain and swelling.  If these treatments do not help or the toe cannot be straightened, surgery is the next option. The most common surgeries used to straighten a hammer toe include:  Arthroplasty. In this procedure, part of the joint is removed, and that allows the toe to straighten.  Fusion. In this procedure, cartilage between the two bones of the joint is taken out and the bones are fused together into one longer bone.  Implantation. In this procedure, part of the bone is removed and replaced with an implant to let the toe move again.  Flexor tendon transfer. In this procedure, the tendons that curl the toes down (flexor tendons) are repositioned.  Follow these instructions at home:  Take over-the-counter and prescription medicines only as told by your health care provider.  Do toe straightening and stretching exercises as told by your health care provider.  Keep all follow-up visits as told by your health care provider. This is important. How is this prevented?  Wear shoes that give your toes enough room and do not cause pain.  Do not wear high-heeled shoes. Contact a health care provider if:  Your pain gets worse.  Your toe becomes red or swollen.  You develop an open sore on your toe. This information is not intended to replace advice given to you by your health care provider. Make sure you discuss any questions you have with your health care provider. Document Released: 08/10/2000 Document Revised: 03/02/2016 Document Reviewed: 12/07/2015 Elsevier Interactive Patient Education  2018 [employer]  

## 2017-09-17 DIAGNOSIS — M25612 Stiffness of left shoulder, not elsewhere classified: Secondary | ICD-10-CM | POA: Diagnosis not present

## 2017-09-17 DIAGNOSIS — R29898 Other symptoms and signs involving the musculoskeletal system: Secondary | ICD-10-CM | POA: Diagnosis not present

## 2017-09-17 DIAGNOSIS — M25512 Pain in left shoulder: Secondary | ICD-10-CM | POA: Diagnosis not present

## 2017-09-17 NOTE — Progress Notes (Signed)
Subjective:   Patient ID: [name] Finley, female   DOB: 72 y.o.   MRN: [id number]   HPI Patient presents with a painful ingrowing toenail left big toe and digital deformity second and third right that he is concerned about.  States the nail that he has tried to trim it and soak it without relief and try to take the corner out himself.  Patient does not smoke and likes to be active   Review of Systems  All other systems reviewed and are negative.       Objective:  Physical Exam  Constitutional: She appears well-developed and well-nourished.  Cardiovascular: Intact distal pulses.  Pulmonary/Chest: Effort normal.  Musculoskeletal: Normal range of motion.  Neurological: She is alert.  Skin: Skin is warm.  Nursing note and vitals reviewed.   Neurovascular status was found to be intact with muscle strength adequate range of motion within normal limits.  Patient is found to have incurvated hallux nail with distal tenderness but no active drainage noted with an incurvated corner that is abnormally growing.  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  I explained the procedure and risk associated with it and patient wants surgery and today I infiltrated 60 mg Xylocaine Marcaine mixture sterile prep was applied to the toe and using sterile instrumentation I removed the border exposed matrix and applied phenol 3 applications 30 seconds followed by alcohol lavage and sterile dressing.  I gave instructions on soaks and reappoint.  As far as digits I do not recommend procedures unless they get worse  X-rays indicate digital structural changes second and third toes of a moderate nature with no other significant  pathology noted

## 2017-09-20 DIAGNOSIS — M25512 Pain in left shoulder: Secondary | ICD-10-CM | POA: Diagnosis not present

## 2017-09-20 DIAGNOSIS — R29898 Other symptoms and signs involving the musculoskeletal system: Secondary | ICD-10-CM | POA: Diagnosis not present

## 2017-09-20 DIAGNOSIS — M25612 Stiffness of left shoulder, not elsewhere classified: Secondary | ICD-10-CM | POA: Diagnosis not present

## 2017-09-24 DIAGNOSIS — M25512 Pain in left shoulder: Secondary | ICD-10-CM | POA: Diagnosis not present

## 2017-09-24 DIAGNOSIS — M25612 Stiffness of left shoulder, not elsewhere classified: Secondary | ICD-10-CM | POA: Diagnosis not present

## 2017-09-24 DIAGNOSIS — R29898 Other symptoms and signs involving the musculoskeletal system: Secondary | ICD-10-CM | POA: Diagnosis not present

## 2017-09-24 DIAGNOSIS — M19012 Primary osteoarthritis, left shoulder: Secondary | ICD-10-CM | POA: Diagnosis not present

## 2017-09-27 DIAGNOSIS — M25612 Stiffness of left shoulder, not elsewhere classified: Secondary | ICD-10-CM | POA: Diagnosis not present

## 2017-09-27 DIAGNOSIS — R29898 Other symptoms and signs involving the musculoskeletal system: Secondary | ICD-10-CM | POA: Diagnosis not present

## 2017-09-27 DIAGNOSIS — M25512 Pain in left shoulder: Secondary | ICD-10-CM | POA: Diagnosis not present

## 2017-10-01 DIAGNOSIS — M19012 Primary osteoarthritis, left shoulder: Secondary | ICD-10-CM | POA: Diagnosis not present

## 2017-10-08 DIAGNOSIS — M25512 Pain in left shoulder: Secondary | ICD-10-CM | POA: Diagnosis not present

## 2017-10-08 DIAGNOSIS — M25612 Stiffness of left shoulder, not elsewhere classified: Secondary | ICD-10-CM | POA: Diagnosis not present

## 2017-10-08 DIAGNOSIS — R29898 Other symptoms and signs involving the musculoskeletal system: Secondary | ICD-10-CM | POA: Diagnosis not present

## 2017-10-10 DIAGNOSIS — R29898 Other symptoms and signs involving the musculoskeletal system: Secondary | ICD-10-CM | POA: Diagnosis not present

## 2017-10-10 DIAGNOSIS — M25612 Stiffness of left shoulder, not elsewhere classified: Secondary | ICD-10-CM | POA: Diagnosis not present

## 2017-10-10 DIAGNOSIS — M25512 Pain in left shoulder: Secondary | ICD-10-CM | POA: Diagnosis not present

## 2017-10-15 DIAGNOSIS — M19012 Primary osteoarthritis, left shoulder: Secondary | ICD-10-CM | POA: Diagnosis not present

## 2017-10-15 DIAGNOSIS — M25512 Pain in left shoulder: Secondary | ICD-10-CM | POA: Diagnosis not present

## 2017-10-16 DIAGNOSIS — R29898 Other symptoms and signs involving the musculoskeletal system: Secondary | ICD-10-CM | POA: Diagnosis not present

## 2017-10-16 DIAGNOSIS — M25512 Pain in left shoulder: Secondary | ICD-10-CM | POA: Diagnosis not present

## 2017-10-16 DIAGNOSIS — M25612 Stiffness of left shoulder, not elsewhere classified: Secondary | ICD-10-CM | POA: Diagnosis not present

## 2017-10-17 DIAGNOSIS — K5130 Ulcerative (chronic) rectosigmoiditis without complications: Secondary | ICD-10-CM | POA: Diagnosis not present

## 2017-10-17 DIAGNOSIS — K219 Gastro-esophageal reflux disease without esophagitis: Secondary | ICD-10-CM | POA: Diagnosis not present

## 2017-10-21 DIAGNOSIS — M25612 Stiffness of left shoulder, not elsewhere classified: Secondary | ICD-10-CM | POA: Diagnosis not present

## 2017-10-21 DIAGNOSIS — M25512 Pain in left shoulder: Secondary | ICD-10-CM | POA: Diagnosis not present

## 2017-10-21 DIAGNOSIS — R29898 Other symptoms and signs involving the musculoskeletal system: Secondary | ICD-10-CM | POA: Diagnosis not present

## 2017-10-25 DIAGNOSIS — M19012 Primary osteoarthritis, left shoulder: Secondary | ICD-10-CM | POA: Diagnosis not present

## 2017-10-28 DIAGNOSIS — H52223 Regular astigmatism, bilateral: Secondary | ICD-10-CM | POA: Diagnosis not present

## 2017-10-28 DIAGNOSIS — H04123 Dry eye syndrome of bilateral lacrimal glands: Secondary | ICD-10-CM | POA: Diagnosis not present

## 2017-10-28 DIAGNOSIS — H5202 Hypermetropia, left eye: Secondary | ICD-10-CM | POA: Diagnosis not present

## 2017-10-29 DIAGNOSIS — M19012 Primary osteoarthritis, left shoulder: Secondary | ICD-10-CM | POA: Diagnosis not present

## 2017-11-10 ENCOUNTER — Encounter: Payer: Self-pay | Admitting: Internal Medicine

## 2017-11-11 ENCOUNTER — Ambulatory Visit: Payer: PPO | Admitting: Internal Medicine

## 2017-11-11 ENCOUNTER — Encounter: Payer: Self-pay | Admitting: Internal Medicine

## 2017-11-11 VITALS — BP 126/90 | HR 62 | Ht 62.5 in | Wt 228.0 lb

## 2017-11-11 DIAGNOSIS — J3089 Other allergic rhinitis: Secondary | ICD-10-CM

## 2017-11-11 DIAGNOSIS — J302 Other seasonal allergic rhinitis: Secondary | ICD-10-CM

## 2017-11-11 DIAGNOSIS — G4733 Obstructive sleep apnea (adult) (pediatric): Secondary | ICD-10-CM | POA: Diagnosis not present

## 2017-11-11 NOTE — Patient Instructions (Addendum)
We can continue CPAP 10, mask of choice, humidifier, supplies, AirView  Hope the Spring allergy season isn't too hard on you.  Please call if we can help

## 2017-11-11 NOTE — Progress Notes (Signed)
Subjective:    Patient ID: [name] Finley, female    DOB: March 27, 1946, 72 y.o.   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 Discuss "So Clean" cleaning system or other alternatives. Denies problems with pressure. Pt states that she thinks she is getting a bone spur in her left nostril on the septum from previous nasal surgery -- Nasal pillows rubs. 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  Sleep is easier for her since she retired.  Comfortable with CPAP and sleeps best with it.  No concerns or acute changes.  Spring pollen rhinitis has not yet arrived.  ROS-see HPI   + = positive Constitutional:    weight loss, night sweats, fevers, chills, fatigue, lassitude. HEENT:    headaches, difficulty swallowing, tooth/dental problems, sore throat,       sneezing, itching, ear ache, + nasal congestion, post nasal drip, snoring CV:    chest pain, orthopnea, PND, swelling in lower extremities, anasarca,                                                 dizziness, palpitations Resp:   shortness of breath with exertion or at rest.                 productive cough,   non-productive cough, coughing up of blood.               change in color of mucus.  wheezing.   Skin:    rash or lesions. GI:  No-   heartburn, indigestion, abdominal pain, nausea, vomiting, diarrhea,                 change in bowel habits, loss of appetite GU: dysuria, change  in color of urine, no urgency or frequency.   flank pain. MS:   joint pain, stiffness, decreased range of motion, back pain. Neuro-     nothing unusual Psych:  change in mood or affect.  depression or anxiety.   memory loss.  .    Objective:  OBJ- Physical Exam General- Alert, Oriented, Affect-appropriate, Distress- none acute, + overweight Skin- rash-none, lesions- none, excoriation- none Lymphadenopathy- none Head- atraumatic            Eyes- Gross vision intact, PERRLA, conjunctivae and secretions clear            Ears- Hearing, canals-normal            Nose- Clear, no-Septal dev, mucus, polyps, erosion, perforation            Throat- Mallampati II , mucosa clear , drainage- none, tonsils- atrophic Neck- flexible , trachea midline, no stridor , thyroid nl, carotid no bruit Chest - symmetrical excursion , unlabored           Heart/CV- RRR , no murmur , no gallop  , no rub, nl s1 s2                           -  JVD- none , edema- none, stasis changes- none, varices- none           Lung- clear to P&A, wheeze- none, cough- none , dullness-none, rub- none           Chest wall-  Abd-  Br/ Gen/ Rectal- Not done, not indicated Extrem- cyanosis- none, clubbing, none, atrophy- none, strength- nl Neuro- grossly intact to observation    Assessment & Plan:

## 2017-11-12 NOTE — Assessment & Plan Note (Signed)
To knees with good control and compliance.  Indicates she sleeps better with it.  No acute concerns.  We discussed comfort and compliance goals. Plan-continue CPAP 10

## 2017-11-12 NOTE — Assessment & Plan Note (Signed)
Reminded Flonase and nonsedating antihistamine are best initial interventions for most people with spring rhinitis.  Hopefully this will not be a bad season for her.

## 2017-11-19 ENCOUNTER — Other Ambulatory Visit: Payer: Self-pay | Admitting: Family Medicine

## 2017-11-19 DIAGNOSIS — Z1231 Encounter for screening mammogram for malignant neoplasm of breast: Secondary | ICD-10-CM

## 2017-12-23 DIAGNOSIS — Z6841 Body Mass Index (BMI) 40.0 and over, adult: Secondary | ICD-10-CM | POA: Diagnosis not present

## 2017-12-23 DIAGNOSIS — G479 Sleep disorder, unspecified: Secondary | ICD-10-CM | POA: Diagnosis not present

## 2017-12-23 DIAGNOSIS — E7800 Pure hypercholesterolemia, unspecified: Secondary | ICD-10-CM | POA: Diagnosis not present

## 2017-12-23 DIAGNOSIS — I10 Essential (primary) hypertension: Secondary | ICD-10-CM | POA: Diagnosis not present

## 2017-12-23 DIAGNOSIS — M810 Age-related osteoporosis without current pathological fracture: Secondary | ICD-10-CM | POA: Diagnosis not present

## 2017-12-24 DIAGNOSIS — Z96652 Presence of left artificial knee joint: Secondary | ICD-10-CM | POA: Diagnosis not present

## 2017-12-24 DIAGNOSIS — M1711 Unilateral primary osteoarthritis, right knee: Secondary | ICD-10-CM | POA: Diagnosis not present

## 2017-12-26 ENCOUNTER — Ambulatory Visit: Payer: PPO

## 2018-02-19 ENCOUNTER — Ambulatory Visit
Admission: RE | Admit: 2018-02-19 | Discharge: 2018-02-19 | Disposition: A | Discharge status: Home / Self Care | Payer: PPO | Source: Ambulatory Visit | Attending: Family Medicine | Admitting: Family Medicine

## 2018-02-19 DIAGNOSIS — Z1231 Encounter for screening mammogram for malignant neoplasm of breast: Secondary | ICD-10-CM

## 2018-02-20 ENCOUNTER — Other Ambulatory Visit: Payer: Self-pay | Admitting: Family Medicine

## 2018-02-20 DIAGNOSIS — R928 Other abnormal and inconclusive findings on diagnostic imaging of breast: Secondary | ICD-10-CM

## 2018-02-24 ENCOUNTER — Other Ambulatory Visit: Payer: Self-pay | Admitting: Family Medicine

## 2018-02-24 ENCOUNTER — Ambulatory Visit
Admission: RE | Admit: 2018-02-24 | Discharge: 2018-02-24 | Disposition: A | Discharge status: Home / Self Care | Payer: PPO | Source: Ambulatory Visit | Attending: Family Medicine | Admitting: Family Medicine

## 2018-02-24 DIAGNOSIS — R928 Other abnormal and inconclusive findings on diagnostic imaging of breast: Secondary | ICD-10-CM

## 2018-02-24 DIAGNOSIS — K5130 Ulcerative (chronic) rectosigmoiditis without complications: Secondary | ICD-10-CM | POA: Diagnosis not present

## 2018-02-24 DIAGNOSIS — N6320 Unspecified lump in the left breast, unspecified quadrant: Secondary | ICD-10-CM

## 2018-02-24 DIAGNOSIS — N6002 Solitary cyst of left breast: Secondary | ICD-10-CM | POA: Diagnosis not present

## 2018-05-01 DIAGNOSIS — K5130 Ulcerative (chronic) rectosigmoiditis without complications: Secondary | ICD-10-CM | POA: Diagnosis not present

## 2018-05-01 DIAGNOSIS — K219 Gastro-esophageal reflux disease without esophagitis: Secondary | ICD-10-CM | POA: Diagnosis not present

## 2018-06-23 DIAGNOSIS — H903 Sensorineural hearing loss, bilateral: Secondary | ICD-10-CM | POA: Diagnosis not present

## 2018-07-11 DIAGNOSIS — Z23 Encounter for immunization: Secondary | ICD-10-CM | POA: Diagnosis not present

## 2018-07-15 DIAGNOSIS — E7800 Pure hypercholesterolemia, unspecified: Secondary | ICD-10-CM | POA: Diagnosis not present

## 2018-07-15 DIAGNOSIS — G479 Sleep disorder, unspecified: Secondary | ICD-10-CM | POA: Diagnosis not present

## 2018-07-15 DIAGNOSIS — R7303 Prediabetes: Secondary | ICD-10-CM | POA: Diagnosis not present

## 2018-07-15 DIAGNOSIS — M150 Primary generalized (osteo)arthritis: Secondary | ICD-10-CM | POA: Diagnosis not present

## 2018-07-15 DIAGNOSIS — Z6841 Body Mass Index (BMI) 40.0 and over, adult: Secondary | ICD-10-CM | POA: Diagnosis not present

## 2018-07-15 DIAGNOSIS — M810 Age-related osteoporosis without current pathological fracture: Secondary | ICD-10-CM | POA: Diagnosis not present

## 2018-07-15 DIAGNOSIS — I10 Essential (primary) hypertension: Secondary | ICD-10-CM | POA: Diagnosis not present

## 2018-08-22 DIAGNOSIS — G4733 Obstructive sleep apnea (adult) (pediatric): Secondary | ICD-10-CM | POA: Diagnosis not present

## 2018-08-29 ENCOUNTER — Ambulatory Visit
Admission: RE | Admit: 2018-08-29 | Discharge: 2018-08-29 | Disposition: A | Discharge status: Home / Self Care | Payer: Medicare Other | Source: Ambulatory Visit | Attending: Family Medicine | Admitting: Family Medicine

## 2018-08-29 ENCOUNTER — Other Ambulatory Visit: Payer: Self-pay | Admitting: Family Medicine

## 2018-08-29 ENCOUNTER — Ambulatory Visit
Admission: RE | Admit: 2018-08-29 | Discharge: 2018-08-29 | Disposition: A | Discharge status: Home / Self Care | Payer: PPO | Source: Ambulatory Visit | Attending: Family Medicine | Admitting: Family Medicine

## 2018-08-29 DIAGNOSIS — N6320 Unspecified lump in the left breast, unspecified quadrant: Secondary | ICD-10-CM

## 2018-09-15 ENCOUNTER — Ambulatory Visit: Payer: Medicare Other | Admitting: Podiatry

## 2018-09-15 DIAGNOSIS — B351 Tinea unguium: Secondary | ICD-10-CM | POA: Diagnosis not present

## 2018-09-15 DIAGNOSIS — L600 Ingrowing nail: Secondary | ICD-10-CM

## 2018-09-15 MED ORDER — TERBINAFINE HCL 250 MG PO TABS: ORAL_TABLET | ORAL | 0 refills | Status: DC

## 2018-09-16 NOTE — Progress Notes (Signed)
Subjective:   Patient ID: [name] Finley, female   DOB: 73 y.o.   MRN: [id number]   HPI Patient presents with distal discoloration of the hallux nails bilateral and is concerned about this and she is had history of this in the past.  Patient states that it is an ongoing issue   ROS      Objective:  Physical Exam  Neurovascular status intact muscle strength is adequate with discoloration of the hallux nail distal lateral aspect bilateral localized in nature with the possibility for traumatic element associated with this condition.  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  At this point she is get a do a pulse antifungal oral therapy and utilize topical medicines and changing polishes with possibility for laser in the future.  Patient will be seen back for [place] to recheck again over the next period of time and again may require other treatments and I did explain to her that no matter what we do this may not improve

## 2018-11-12 ENCOUNTER — Encounter: Payer: Self-pay | Admitting: Internal Medicine

## 2018-11-13 ENCOUNTER — Encounter: Payer: Self-pay | Admitting: Internal Medicine

## 2018-11-13 ENCOUNTER — Ambulatory Visit: Payer: Medicare Other | Admitting: Internal Medicine

## 2018-11-13 ENCOUNTER — Other Ambulatory Visit: Payer: Self-pay

## 2018-11-13 VITALS — BP 122/70 | HR 72 | Ht 62.0 in | Wt 228.4 lb

## 2018-11-13 DIAGNOSIS — J302 Other seasonal allergic rhinitis: Secondary | ICD-10-CM | POA: Diagnosis not present

## 2018-11-13 DIAGNOSIS — J3089 Other allergic rhinitis: Secondary | ICD-10-CM

## 2018-11-13 DIAGNOSIS — G4733 Obstructive sleep apnea (adult) (pediatric): Secondary | ICD-10-CM | POA: Diagnosis not present

## 2018-11-13 NOTE — Patient Instructions (Signed)
Order- DME [facility] please increase CPAP to 11 cwp, continue mask of choice, humidifier, supplies, AirView  Please call if we can help

## 2018-11-13 NOTE — Progress Notes (Signed)
Subjective:    Patient ID: [name] Finley, female    DOB: 1946/05/28, 73 y.o.   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  Sleep is easier for her since she retired.  Comfortable with CPAP and sleeps best with it.  No concerns or acute changes.  Spring pollen rhinitis has not yet arrived.  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 Denies sick exposure, sore throat, fever, myalgias or GI upset.  ROS-see HPI   + = positive Constitutional:    weight loss, night sweats, fevers, chills, fatigue, lassitude. HEENT:    headaches, difficulty swallowing, tooth/dental problems, sore throat,       sneezing, itching, ear ache, + nasal congestion, post nasal drip, snoring CV:    chest pain, orthopnea, PND, swelling in lower extremities, anasarca,                                              dizziness, palpitations Resp:   shortness of breath with exertion or at rest.                 productive cough,   non-productive cough, coughing up of blood.               change in color of mucus.  wheezing.   Skin:    rash or lesions. GI:  No-   heartburn, indigestion, abdominal pain, nausea, vomiting, diarrhea,                 change in bowel  habits, loss of appetite GU: dysuria, change in color of urine, no urgency or frequency.   flank pain. MS:   joint pain, stiffness, decreased range of motion, back pain. Neuro-     nothing unusual Psych:  change in mood or affect.  depression or anxiety.   memory loss.  .    Objective:  OBJ- Physical Exam General- Alert, Oriented, Affect-appropriate, Distress- none acute, + obese Skin- rash-none, lesions- none, excoriation- none Lymphadenopathy- none Head- atraumatic            Eyes- Gross vision intact, PERRLA, conjunctivae and secretions clear            Ears- Hearing, canals-normal            Nose- Clear, no-Septal dev, mucus, polyps, erosion, perforation            Throat- Mallampati II , mucosa clear , drainage- none, tonsils- atrophic Neck- flexible , trachea midline, no stridor , thyroid nl, carotid no bruit Chest - symmetrical excursion , unlabored           Heart/CV- RRR , no murmur , no gallop  , no rub, nl s1 s2                           -  JVD- none , edema- none, stasis changes- none, varices- none           Lung- clear to P&A, wheeze- none, cough- none , dullness-none, rub- none           Chest wall-  Abd-  Br/ Gen/ Rectal- Not done, not indicated Extrem- cyanosis- none, clubbing, none, atrophy- none, strength- nl Neuro- grossly intact to observation    Assessment & Plan:

## 2018-11-15 NOTE — Assessment & Plan Note (Signed)
Discussed flonase anc claritn for symptoms. Covid precautions reviewed.

## 2018-11-15 NOTE — Assessment & Plan Note (Signed)
She continues to benefit from CPAP with improved sleep. Download confirms good compliance. We would like to get AHI down to 5. Plan- increase CPAP to 11.

## 2018-12-26 ENCOUNTER — Other Ambulatory Visit: Payer: Self-pay | Admitting: Podiatry

## 2019-01-28 ENCOUNTER — Ambulatory Visit: Payer: Medicare Other | Admitting: Podiatry

## 2019-01-28 ENCOUNTER — Encounter: Payer: Self-pay | Admitting: Podiatry

## 2019-01-28 ENCOUNTER — Other Ambulatory Visit: Payer: Self-pay

## 2019-01-28 VITALS — Temp 97.5°F

## 2019-01-28 DIAGNOSIS — B351 Tinea unguium: Secondary | ICD-10-CM | POA: Diagnosis not present

## 2019-01-28 NOTE — Progress Notes (Signed)
Subjective:   Patient ID: [name] Finley, female   DOB: 73 y.o.   MRN: [id number]   HPI Patient presents stating she cannot tell she has had improvement after taking pulse Lamisil treatment   ROS      Objective:  Physical Exam  Neurovascular status unchanged with patient found to have moderate distal discoloration of the hallux nails and several other nails bilateral.  There is also found to be moderate digital deformities and C-shaped appearance to especially the hallux nails bilateral     Assessment:  Appears to be more of a mycotic type process but also trauma is the precipitating factor to the condition she is experiencing with abnormal nail positions     Plan:  H&P and spent a great deal time going over and educating her on treatment options.  I do not think we can do anything about trauma and I tried to educate this back to her and I did discuss laser possible doing pulse Lamisil in the fall along with topical but that doing all of this she still may not get the titer results she is looking for and also she does have some curvature the nailbeds in general.  Patient will have laser done will consider further pulse therapy and again all questions answered today

## 2019-02-23 ENCOUNTER — Other Ambulatory Visit: Payer: Medicare Other

## 2019-03-03 ENCOUNTER — Other Ambulatory Visit: Payer: Medicare Other

## 2019-03-25 ENCOUNTER — Ambulatory Visit
Admission: RE | Admit: 2019-03-25 | Discharge: 2019-03-25 | Disposition: A | Discharge status: Home / Self Care | Payer: Medicare Other | Source: Ambulatory Visit | Attending: Family Medicine | Admitting: Family Medicine

## 2019-03-25 ENCOUNTER — Other Ambulatory Visit: Payer: Self-pay

## 2019-03-25 DIAGNOSIS — N6320 Unspecified lump in the left breast, unspecified quadrant: Secondary | ICD-10-CM

## 2019-04-12 ENCOUNTER — Emergency Department (HOSPITAL_COMMUNITY)
Admission: EM | Admit: 2019-04-12 | Discharge: 2019-04-12 | Disposition: A | Discharge status: Home / Self Care | Payer: Medicare Other | Attending: Emergency Medicine | Admitting: Emergency Medicine

## 2019-04-12 ENCOUNTER — Emergency Department (HOSPITAL_COMMUNITY): Payer: Medicare Other

## 2019-04-12 ENCOUNTER — Encounter (HOSPITAL_COMMUNITY): Payer: Self-pay

## 2019-04-12 DIAGNOSIS — S0083XA Contusion of other part of head, initial encounter: Secondary | ICD-10-CM | POA: Diagnosis not present

## 2019-04-12 DIAGNOSIS — Z7984 Long term (current) use of oral hypoglycemic drugs: Secondary | ICD-10-CM | POA: Insufficient documentation

## 2019-04-12 DIAGNOSIS — S0992XA Unspecified injury of nose, initial encounter: Secondary | ICD-10-CM | POA: Diagnosis present

## 2019-04-12 DIAGNOSIS — M25531 Pain in right wrist: Secondary | ICD-10-CM | POA: Diagnosis not present

## 2019-04-12 DIAGNOSIS — M542 Cervicalgia: Secondary | ICD-10-CM | POA: Diagnosis not present

## 2019-04-12 DIAGNOSIS — I10 Essential (primary) hypertension: Secondary | ICD-10-CM | POA: Insufficient documentation

## 2019-04-12 DIAGNOSIS — Z79899 Other long term (current) drug therapy: Secondary | ICD-10-CM | POA: Diagnosis not present

## 2019-04-12 DIAGNOSIS — S022XXA Fracture of nasal bones, initial encounter for closed fracture: Secondary | ICD-10-CM | POA: Insufficient documentation

## 2019-04-12 DIAGNOSIS — Y999 Unspecified external cause status: Secondary | ICD-10-CM | POA: Insufficient documentation

## 2019-04-12 DIAGNOSIS — Y92410 Unspecified street and highway as the place of occurrence of the external cause: Secondary | ICD-10-CM | POA: Insufficient documentation

## 2019-04-12 DIAGNOSIS — W01198A Fall on same level from slipping, tripping and stumbling with subsequent striking against other object, initial encounter: Secondary | ICD-10-CM | POA: Diagnosis not present

## 2019-04-12 DIAGNOSIS — M79641 Pain in right hand: Secondary | ICD-10-CM | POA: Diagnosis not present

## 2019-04-12 DIAGNOSIS — J45909 Unspecified asthma, uncomplicated: Secondary | ICD-10-CM | POA: Diagnosis not present

## 2019-04-12 DIAGNOSIS — Y9301 Activity, walking, marching and hiking: Secondary | ICD-10-CM | POA: Insufficient documentation

## 2019-04-12 NOTE — ED Notes (Signed)
ED Provider at bedside. 

## 2019-04-12 NOTE — Discharge Instructions (Signed)
Take over-the-counter medications as needed for pain.  Apply ice to help with the swelling.  The nasal fracture should heal on its own without difficulty but if you have any persistent symptoms consider following up with an ear nose and throat doctor

## 2019-04-12 NOTE — ED Provider Notes (Signed)
[facility] DEPT Provider Note   CSN: [id number] Arrival date & time: 04/12/19  1236    History   Chief Complaint Chief Complaint  Patient presents with   Fall    HPI [name] Finley is a 73 y.o. female.     HPI Patient presents to the emergency room for evaluation after a fall and a facial injury.  Patient states she was walking outside when she tripped on uneven pavement.  Patient fell forward striking her face and head against the ground.  Patient did not lose consciousness.  She does however have a large contusion on her forehead and is having significant nasal pain.  She did have bleeding from her nose.  She is having some neck stiffness.  She denies any nausea vomiting.  No abdominal pain she has some soreness in her right hand and wrist but she does not think that is severe and does not want any x-ray imaging.  Patient is overall concerned because she had a friend who died after head injury even though the initial CT scan was normal. Past Medical History:  Diagnosis Date   Allergic rhinitis    Asthma    Hypertension    Sleep apnea     Patient Active Problem List   Diagnosis Date Noted   Glenohumeral arthritis, left 08/11/2017   Acute upper respiratory infection 09/07/2015   Obstructive sleep apnea 09/13/2008   HYPERTENSION 03/01/2008   Seasonal and perennial allergic rhinitis 03/01/2008    Past Surgical History:  Procedure Laterality Date   BREAST EXCISIONAL BIOPSY     BREAST SURGERY     NON MALIGNANT TISSUE REMOVED      OB History   No obstetric history on file.      Home Medications    Prior to Admission medications   Medication Sig Start Date End Date Taking? Authorizing Provider  acetaminophen (TYLENOL) 650 MG CR tablet Take 650 mg by mouth every 8 (eight) hours as needed for pain.   Yes [provider]  Ascorbic Acid (VITAMIN C) 1000 MG tablet Take 1,000 mg by mouth every evening.   Yes  [provider]  atorvastatin (LIPITOR) 10 MG tablet Take 10 mg by mouth every evening.    Yes [provider]  B Complex-C (B-COMPLEX WITH VITAMIN C) tablet Take 1 tablet by mouth daily.   Yes [provider]  calcium citrate-vitamin D (CITRACAL+D) 315-200 MG-UNIT tablet Take 1 tablet by mouth 2 (two) times daily.   Yes [provider]  Cholecalciferol (VITAMIN D) 2000 units CAPS Take 2,000 Units by mouth 2 (two) times daily.    Yes [provider]  fexofenadine (ALLEGRA) 180 MG tablet Take 90 mg by mouth every evening. Takes 1/2-1 tablet daily.   Yes [provider]  glucosamine-chondroitin 500-400 MG tablet Take 1 tablet by mouth daily.   Yes [provider]  losartan-hydrochlorothiazide (HYZAAR) 100-12.5 MG per tablet Take 1 tablet by mouth daily.  02/11/12  Yes [provider]  Melatonin 5 MG TABS Take 1 tablet by mouth at bedtime.    Yes [provider]  mesalamine (LIALDA) 1.2 g EC tablet Take 2.4 g by mouth daily with breakfast.   Yes [provider]  metFORMIN (GLUCOPHAGE) 500 MG tablet Take 500 mg by mouth 2 (two) times daily with a meal.  01/13/19  Yes [provider]  Multiple Vitamins-Minerals (MULTIVITAMIN WITH MINERALS) tablet Take 1 tablet by mouth daily.   Yes [provider]  Triamcinolone Acetonide (NASACORT ALLERGY 24HR NA) Place 2 sprays into the nose at bedtime as needed (allergies).    Yes [provider]  TURMERIC PO Take 1 capsule by mouth daily.   Yes [provider]  alendronate (FOSAMAX) 70 MG tablet Take 70 mg by mouth once a week.  03/09/19   [provider]  benzonatate (TESSALON) 200 MG capsule Take 1 capsule (200 mg total) by mouth 3 (three) times daily as needed for cough. Patient not taking: Reported on 04/12/2019 09/07/15   Deneise Lever, MD  Calcium & Magnesium Carbonates (MYLANTA PO) Take 30 mLs by mouth daily as needed  (constipation).     [provider]  Famotidine (PEPCID PO) Take 1 tablet by mouth daily as needed (heartburn).     [provider]  traMADol (ULTRAM) 50 MG tablet Take 1 tablet (50 mg total) by mouth every 6 (six) hours as needed. Patient not taking: Reported on 04/12/2019 09/07/15   Deneise Lever, MD  zolpidem (AMBIEN) 5 MG tablet Take 5 mg by mouth at bedtime as needed for sleep.    [provider]    Family History Family History  Problem Relation Age of Onset   Allergies Father    Asthma Other        COUSIN    Social History Social History   Tobacco Use   Smoking status: Never Smoker   Smokeless tobacco: Never Used  Substance Use Topics   Alcohol use: No   Drug use: No     Allergies   Nsaids and Pollen extract   Review of Systems Review of Systems  All other systems reviewed and are negative.    Physical Exam Updated Vital Signs BP (!) 153/88 (BP Location: Right Arm) Comment: Simultaneous filing. User may not have seen previous data.   Pulse 80 Comment: Simultaneous filing. User may not have seen previous data.   Temp 98.5 F (36.9 C) (Oral)    Resp 16    Ht 1.575 m (5\' 2" )    Wt 100.2 kg    SpO2 100% Comment: Simultaneous filing. User may not have seen previous data.   BMI 40.42 kg/m   Physical Exam Vitals signs and nursing note reviewed.  Constitutional:      General: She is not in acute distress.    Appearance: She is well-developed.  HENT:     Head: Normocephalic.     Comments: Contusion right forehead, nasal contusion and swelling    Right Ear: External ear normal.     Left Ear: External ear normal.  Eyes:     General: No scleral icterus.       Right eye: No discharge.        Left eye: No discharge.     Conjunctiva/sclera: Conjunctivae normal.  Neck:     Musculoskeletal: Neck supple.     Trachea: No tracheal deviation.  Cardiovascular:     Rate and Rhythm: Normal rate.  Pulmonary:     Effort: Pulmonary effort  is normal. No respiratory distress.     Breath sounds: No stridor.  Abdominal:     General: There is no distension.  Musculoskeletal:        General: No swelling or deformity.     Right wrist: Normal.     Cervical back: Normal.     Thoracic back: Normal.     Lumbar back: Normal.     Comments: Small amount of bruising noted on the thenar  eminence, no significant tenderness palpation  Skin:    General: Skin is warm and dry.     Findings: No rash.  Neurological:     Mental Status: She is alert.     Cranial Nerves: Cranial nerve deficit: no gross deficits.      ED Treatments / Results  Labs (all labs ordered are listed, but only abnormal results are displayed) Labs Reviewed - No data to display  EKG None  Radiology Ct Head Wo Contrast  Result Date: 04/12/2019 CLINICAL DATA:  Forehead redness and swelling and dried blood on the nose after falling today on asphalt. EXAM: CT HEAD WITHOUT CONTRAST CT MAXILLOFACIAL WITHOUT CONTRAST CT CERVICAL SPINE WITHOUT CONTRAST TECHNIQUE: Multidetector CT imaging of the head, cervical spine, and maxillofacial structures were performed using the standard protocol without intravenous contrast. Multiplanar CT image reconstructions of the cervical spine and maxillofacial structures were also generated. COMPARISON:  None. FINDINGS: CT HEAD FINDINGS Brain: Mild-to-moderate enlargement of the ventricles and subarachnoid spaces. Minimal patchy white matter low density in both cerebral hemispheres. No intracranial hemorrhage, mass lesion or CT evidence of acute infarction. Vascular: No hyperdense vessel or unexpected calcification. Skull: Normal. Negative for fracture or focal lesion. Other: Mild right forehead hematoma. Moderate right TMJ degenerative changes. CT MAXILLOFACIAL FINDINGS Osseous: Nondepressed fracture of the tip of the nasal bone. The anterior maxillary spine is intact. Moderate right TMJ degenerative changes. Orbits: Status post bilateral cataract  extraction. Otherwise, unremarkable orbits. Sinuses: Normally pneumatized. Soft tissues: Anterior and right nasal soft tissue air and mild irregularity compatible with a laceration. CT CERVICAL SPINE FINDINGS Alignment: Mild kyphosis and mild anterolisthesis at the C4-5 level. Skull base and vertebrae: No acute fracture. No primary bone lesion or focal pathologic process. Soft tissues and spinal canal: No prevertebral fluid or swelling. No visible canal hematoma. Disc levels: Posterior element fusion on the right at the C2-3 and C3-4 levels. Facet degenerative changes throughout the cervical and upper thoracic spine. These are most pronounced at the C4-5 level and in the upper thoracic spine. Upper chest: Clear lung apices. Other: None. IMPRESSION: 1. Nondepressed fracture of the tip of the nasal bone. 2. No skull fracture or intracranial hemorrhage. 3. No cervical spine fracture or traumatic subluxation. 4. Mild to moderate diffuse cerebral and cerebellar atrophy. 5. Minimal chronic small vessel white matter ischemic changes in both cerebral hemispheres. 6. Multilevel cervical spine degenerative changes. 7. Moderate right TMJ degenerative changes. 8. Mild right frontal scalp hematoma and right nasal laceration. Electronically Signed   By: Claudie Revering M.D.   On: 04/12/2019 14:09   Ct Cervical Spine Wo Contrast  Result Date: 04/12/2019 CLINICAL DATA:  Forehead redness and swelling and dried blood on the nose after falling today on asphalt. EXAM: CT HEAD WITHOUT CONTRAST CT MAXILLOFACIAL WITHOUT CONTRAST CT CERVICAL SPINE WITHOUT CONTRAST TECHNIQUE: Multidetector CT imaging of the head, cervical spine, and maxillofacial structures were performed using the standard protocol without intravenous contrast. Multiplanar CT image reconstructions of the cervical spine and maxillofacial structures were also generated. COMPARISON:  None. FINDINGS: CT HEAD FINDINGS Brain: Mild-to-moderate enlargement of the ventricles and  subarachnoid spaces. Minimal patchy white matter low density in both cerebral hemispheres. No intracranial hemorrhage, mass lesion or CT evidence of acute infarction. Vascular: No hyperdense vessel or unexpected calcification. Skull: Normal. Negative for fracture or focal lesion. Other: Mild right forehead hematoma. Moderate right TMJ degenerative changes. CT MAXILLOFACIAL FINDINGS Osseous: Nondepressed fracture of the tip of the nasal bone. The anterior maxillary spine  is intact. Moderate right TMJ degenerative changes. Orbits: Status post bilateral cataract extraction. Otherwise, unremarkable orbits. Sinuses: Normally pneumatized. Soft tissues: Anterior and right nasal soft tissue air and mild irregularity compatible with a laceration. CT CERVICAL SPINE FINDINGS Alignment: Mild kyphosis and mild anterolisthesis at the C4-5 level. Skull base and vertebrae: No acute fracture. No primary bone lesion or focal pathologic process. Soft tissues and spinal canal: No prevertebral fluid or swelling. No visible canal hematoma. Disc levels: Posterior element fusion on the right at the C2-3 and C3-4 levels. Facet degenerative changes throughout the cervical and upper thoracic spine. These are most pronounced at the C4-5 level and in the upper thoracic spine. Upper chest: Clear lung apices. Other: None. IMPRESSION: 1. Nondepressed fracture of the tip of the nasal bone. 2. No skull fracture or intracranial hemorrhage. 3. No cervical spine fracture or traumatic subluxation. 4. Mild to moderate diffuse cerebral and cerebellar atrophy. 5. Minimal chronic small vessel white matter ischemic changes in both cerebral hemispheres. 6. Multilevel cervical spine degenerative changes. 7. Moderate right TMJ degenerative changes. 8. Mild right frontal scalp hematoma and right nasal laceration. Electronically Signed   By: Claudie Revering M.D.   On: 04/12/2019 14:09   Ct Maxillofacial Wo Contrast  Result Date: 04/12/2019 CLINICAL DATA:   Forehead redness and swelling and dried blood on the nose after falling today on asphalt. EXAM: CT HEAD WITHOUT CONTRAST CT MAXILLOFACIAL WITHOUT CONTRAST CT CERVICAL SPINE WITHOUT CONTRAST TECHNIQUE: Multidetector CT imaging of the head, cervical spine, and maxillofacial structures were performed using the standard protocol without intravenous contrast. Multiplanar CT image reconstructions of the cervical spine and maxillofacial structures were also generated. COMPARISON:  None. FINDINGS: CT HEAD FINDINGS Brain: Mild-to-moderate enlargement of the ventricles and subarachnoid spaces. Minimal patchy white matter low density in both cerebral hemispheres. No intracranial hemorrhage, mass lesion or CT evidence of acute infarction. Vascular: No hyperdense vessel or unexpected calcification. Skull: Normal. Negative for fracture or focal lesion. Other: Mild right forehead hematoma. Moderate right TMJ degenerative changes. CT MAXILLOFACIAL FINDINGS Osseous: Nondepressed fracture of the tip of the nasal bone. The anterior maxillary spine is intact. Moderate right TMJ degenerative changes. Orbits: Status post bilateral cataract extraction. Otherwise, unremarkable orbits. Sinuses: Normally pneumatized. Soft tissues: Anterior and right nasal soft tissue air and mild irregularity compatible with a laceration. CT CERVICAL SPINE FINDINGS Alignment: Mild kyphosis and mild anterolisthesis at the C4-5 level. Skull base and vertebrae: No acute fracture. No primary bone lesion or focal pathologic process. Soft tissues and spinal canal: No prevertebral fluid or swelling. No visible canal hematoma. Disc levels: Posterior element fusion on the right at the C2-3 and C3-4 levels. Facet degenerative changes throughout the cervical and upper thoracic spine. These are most pronounced at the C4-5 level and in the upper thoracic spine. Upper chest: Clear lung apices. Other: None. IMPRESSION: 1. Nondepressed fracture of the tip of the nasal bone.  2. No skull fracture or intracranial hemorrhage. 3. No cervical spine fracture or traumatic subluxation. 4. Mild to moderate diffuse cerebral and cerebellar atrophy. 5. Minimal chronic small vessel white matter ischemic changes in both cerebral hemispheres. 6. Multilevel cervical spine degenerative changes. 7. Moderate right TMJ degenerative changes. 8. Mild right frontal scalp hematoma and right nasal laceration. Electronically Signed   By: Claudie Revering M.D.   On: 04/12/2019 14:09    Procedures Procedures (including critical care time)  Medications Ordered in ED Medications - No data to display   Initial Impression / Assessment and Plan /  ED Course  I have reviewed the triage vital signs and the nursing notes.  Pertinent labs & imaging results that were available during my care of the patient were reviewed by me and considered in my medical decision making (see chart for details).  Clinical Course as of Apr 11 1505  Sun Apr 12, 2019  1319 Pt declined xray imaging of hand/wrist   [JK]    Clinical Course User Index [JK] Dorie Rank, MD     CT scans without any worrisome findings.  Frontal scalp hematoma noted.  Nondisplaced nasal fracture noted.  No significant nasal laceration noted on exam.  Discussed outpatient management with ice and over-the-counter pain medications.  I explained the patient that her nasal fracture should heal without any difficulty but she did request the name of an ENT doctor.  At this time there does not appear to be any evidence of an acute emergency medical condition and the patient appears stable for discharge with appropriate outpatient follow up.   Final Clinical Impressions(s) / ED Diagnoses   Final diagnoses:  Closed fracture of nasal bone, initial encounter  Contusion of face, initial encounter    ED Discharge Orders    None       Dorie Rank, MD 04/12/19 [id number]

## 2019-04-12 NOTE — ED Notes (Signed)
Patient transported to CT 

## 2019-04-12 NOTE — ED Triage Notes (Signed)
Pt was going for walk today and was looking at scenery. Pt states the asphault was uneven  And she tripped. Redness and swelling noted to forehead, swelling and dried blood on nose, scrapes on bilateral hands. Pt ambulatory. No LOC.

## 2019-04-13 ENCOUNTER — Telehealth: Payer: Self-pay | Admitting: Internal Medicine

## 2019-04-13 NOTE — Telephone Encounter (Signed)
She can use CPAP again as soon as it doesn't hurt.  She can talk with her DME company about using a different style mask that doesn't press on that part of her nose. Meanwhile, suggest she sleep off the flat of her back, or maybe head elevated, such as in a recliner.

## 2019-04-13 NOTE — Telephone Encounter (Signed)
Called and spoke with pt who stated she fell yesterday 8/16 and broke the tip of her nose as well as having a black eye. Pt said that she went to [facility] and had a scan performed and said that the area she broke was mainly the cartilage in her nose. Due to this, she is afraid to put the nasal pillows in her nose but said she needs to know any other recommendations as she does have severe sleep apnea.  Dr. Annamaria Boots, please advise on this for pt. Thanks!

## 2019-04-13 NOTE — Telephone Encounter (Signed)
Called and spoke w/ pt regarding CY's recommendations. Pt verbalized understanding. I let pt know to give our office a call back and ask for Janeth Rase if her current home care company, [employer], is not able to help her find the mask she needs. Pt expressed understanding. Nothing further needed at this time.

## 2019-09-04 ENCOUNTER — Other Ambulatory Visit: Payer: Self-pay | Admitting: Family Medicine

## 2019-09-04 DIAGNOSIS — M810 Age-related osteoporosis without current pathological fracture: Secondary | ICD-10-CM

## 2019-10-27 ENCOUNTER — Other Ambulatory Visit: Payer: Self-pay | Admitting: Family Medicine

## 2019-10-27 DIAGNOSIS — Z1231 Encounter for screening mammogram for malignant neoplasm of breast: Secondary | ICD-10-CM

## 2019-10-30 ENCOUNTER — Ambulatory Visit
Admission: RE | Admit: 2019-10-30 | Discharge: 2019-10-30 | Disposition: A | Discharge status: Home / Self Care | Payer: Medicare Other | Source: Ambulatory Visit | Attending: Family Medicine | Admitting: Family Medicine

## 2019-10-30 ENCOUNTER — Other Ambulatory Visit: Payer: Self-pay

## 2019-10-30 DIAGNOSIS — M810 Age-related osteoporosis without current pathological fracture: Secondary | ICD-10-CM

## 2019-11-24 ENCOUNTER — Other Ambulatory Visit: Payer: Medicare Other

## 2020-03-21 ENCOUNTER — Telehealth: Payer: Self-pay | Admitting: Internal Medicine

## 2020-03-21 DIAGNOSIS — G4733 Obstructive sleep apnea (adult) (pediatric): Secondary | ICD-10-CM

## 2020-03-21 NOTE — Telephone Encounter (Signed)
Order DME [facility]- please replace old CPAP machine, change to auto 5-15, prefer Resmed machine, mask of choice, humidifier, supplies, airView/ card  It has been over a year since we last saw her.  Please set up ROV in about 3 months.

## 2020-03-21 NOTE — Telephone Encounter (Signed)
Spoke with the pt  She states needing new CPAP  Her machine is more than 75 years old  She specifically would like to know what type of machine [facility] would recommend for her She asked about the res med auto machine  Please advise thanks

## 2020-03-21 NOTE — Telephone Encounter (Signed)
Spoke with the pt and scheduled appt and also I have ordered new CPAP Nothing further needed

## 2020-03-25 ENCOUNTER — Other Ambulatory Visit: Payer: Self-pay

## 2020-03-25 ENCOUNTER — Ambulatory Visit
Admission: RE | Admit: 2020-03-25 | Discharge: 2020-03-25 | Disposition: A | Discharge status: Home / Self Care | Payer: Medicare Other | Source: Ambulatory Visit | Attending: Family Medicine | Admitting: Family Medicine

## 2020-03-25 DIAGNOSIS — Z1231 Encounter for screening mammogram for malignant neoplasm of breast: Secondary | ICD-10-CM

## 2020-04-04 ENCOUNTER — Telehealth: Payer: Self-pay | Admitting: Internal Medicine

## 2020-04-04 NOTE — Telephone Encounter (Signed)
Response from [facility]:     Miquel Dunn sent to Miquel Dunn; Kipp Brood, Debbra Riding; Kem Kays Hello,   Order was received and is in process. However we are in need of the following items to complete the requested order.  1. Need usage and benefits notes for CPAP from the MD.  Once we have this needed item we will be able to move forward with processing.   Thank you,  Leroy Sea

## 2020-04-04 NOTE — Telephone Encounter (Signed)
Tried calling the pt and she did not answer- no VM picked up and unable to LM

## 2020-04-05 NOTE — Telephone Encounter (Signed)
Pt needs to be seen in order for this documentation to be taken care of. ATC pt, there was no answer and no option to leave a message. Will try back.

## 2020-04-06 NOTE — Telephone Encounter (Signed)
ATC, NA and no option to leave msg 

## 2020-04-07 NOTE — Telephone Encounter (Signed)
Called and spoke to pt. Informed her of the situation. Appt made with pt to see CY. Pt last seen in 10/2018.   Will forward to CY as FYI.

## 2020-04-20 ENCOUNTER — Encounter: Payer: Self-pay | Admitting: Internal Medicine

## 2020-04-21 ENCOUNTER — Ambulatory Visit: Payer: Medicare Other | Admitting: Internal Medicine

## 2020-04-21 ENCOUNTER — Other Ambulatory Visit: Payer: Self-pay

## 2020-04-21 ENCOUNTER — Encounter: Payer: Self-pay | Admitting: Internal Medicine

## 2020-04-21 VITALS — BP 122/62 | HR 71 | Temp 97.8°F | Ht 62.0 in | Wt 206.4 lb

## 2020-04-21 DIAGNOSIS — G4733 Obstructive sleep apnea (adult) (pediatric): Secondary | ICD-10-CM | POA: Diagnosis not present

## 2020-04-21 DIAGNOSIS — I10 Essential (primary) hypertension: Secondary | ICD-10-CM | POA: Diagnosis not present

## 2020-04-21 NOTE — Patient Instructions (Signed)
Order- Patient asks change from [facility] to [facility]             Replace old machine auto 5-15, mask of choice,humidifier,supplies, AirView/ card  Please call if we can help

## 2020-04-21 NOTE — Progress Notes (Deleted)
Subjective:    Patient ID: [name] Finley, female    DOB: [birth year], 74 y.o.   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 Denies sick exposure, sore throat, fever, myalgias or GI upset.  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    ROS-see HPI   + = positive Constitutional:    weight loss, night sweats, fevers, chills, fatigue, lassitude. HEENT:    headaches, difficulty swallowing, tooth/dental problems, sore throat,       sneezing, itching, ear ache, + nasal congestion, post nasal drip, snoring CV:    chest pain, orthopnea, PND, swelling in lower extremities, anasarca,                                              dizziness, palpitations Resp:   shortness of breath with exertion or at rest.                 productive cough,   non-productive cough, coughing up of blood.               change in color of mucus.  wheezing.   Skin:    rash or lesions. GI:  No-   heartburn, indigestion, abdominal pain, nausea, vomiting, diarrhea,                 change in bowel habits, loss of appetite GU: dysuria, change in color of urine, no urgency or frequency.   flank  pain. MS:   joint pain, stiffness, decreased range of motion, back pain. Neuro-     nothing unusual Psych:  change in mood or affect.  depression or anxiety.   memory loss.  .    Objective:  OBJ- Physical Exam General- Alert, Oriented, Affect-appropriate, Distress- none acute, + obese Skin- rash-none, lesions- none, excoriation- none Lymphadenopathy- none Head- atraumatic            Eyes- Gross vision intact, PERRLA, conjunctivae and secretions clear            Ears- Hearing, canals-normal            Nose- Clear, no-Septal dev, mucus, polyps, erosion, perforation            Throat- Mallampati II , mucosa clear , drainage- none, tonsils- atrophic Neck- flexible , trachea midline, no stridor , thyroid nl, carotid no bruit Chest - symmetrical excursion , unlabored           Heart/CV- RRR , no murmur , no gallop  , no rub, nl s1 s2                           -  JVD- none , edema- none, stasis changes- none, varices- none           Lung- clear to P&A, wheeze- none, cough- none , dullness-none, rub- none           Chest wall-  Abd-  Br/ Gen/ Rectal- Not done, not indicated Extrem- cyanosis- none, clubbing, none, atrophy- none, strength- nl Neuro- grossly intact to observation    Assessment & Plan:

## 2020-04-21 NOTE — Progress Notes (Signed)
Subjective:    Patient ID: [name] Finley, female    DOB: 1945/09/20, 74 y.o.   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 Denies sick exposure, sore throat, fever, myalgias or GI upset.  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  ROS-see HPI   + = positive Constitutional:    weight loss, night sweats, fevers, chills, fatigue, lassitude. HEENT:    headaches, difficulty swallowing, tooth/dental problems, sore throat,       sneezing, itching, ear ache, + nasal congestion, post nasal drip, snoring CV:    chest pain, orthopnea, PND, swelling in lower extremities, anasarca,                                               dizziness, palpitations Resp:   shortness of breath with exertion or at rest.                 productive cough,   non-productive cough, coughing up of blood.               change in color of  mucus.  wheezing.   Skin:    rash or lesions. GI:  No-   heartburn, indigestion, abdominal pain, nausea, vomiting, diarrhea,                 change in bowel habits, loss of appetite GU: dysuria, change in color of urine, no urgency or frequency.   flank pain. MS:   joint pain, stiffness, decreased range of motion, back pain. Neuro-     nothing unusual Psych:  change in mood or affect.  depression or anxiety.   memory loss.  .    Objective:  OBJ- Physical Exam General- Alert, Oriented, Affect-appropriate, Distress- none acute, + obese Skin- rash-none, lesions- none, excoriation- none Lymphadenopathy- none Head- atraumatic            Eyes- Gross vision intact, PERRLA, conjunctivae and secretions clear            Ears- Hearing, canals-normal            Nose- Clear, no-Septal dev, mucus, polyps, erosion, perforation            Throat- Mallampati II , mucosa clear , drainage- none, tonsils- atrophic Neck- flexible , trachea midline, no stridor , thyroid nl, carotid no bruit  Chest - symmetrical excursion , unlabored           Heart/CV- RRR , no murmur , no gallop  , no rub, nl s1 s2                           - JVD- none , edema- none, stasis changes- none, varices- none           Lung- clear to P&A, wheeze- none, cough- none , dullness-none, rub- none           Chest wall-  Abd-  Br/ Gen/ Rectal- Not done, not indicated Extrem- cyanosis- none, clubbing, none, atrophy- none, strength- nl Neuro- grossly intact to observation    Assessment & Plan:

## 2020-05-10 NOTE — Assessment & Plan Note (Signed)
Benefits from CPAP with better sleep. Compliance is great. Leak and residual apneas are higher than we want. She wants to change DME and is due for replacement machine. Plan- replace old machine and change DME per her request, auto 5-15

## 2020-05-10 NOTE — Assessment & Plan Note (Signed)
She understands there may be link between HTN and OSA, emphasizing goal of OSA control.

## 2020-05-11 ENCOUNTER — Telehealth: Payer: Self-pay | Admitting: Internal Medicine

## 2020-05-11 DIAGNOSIS — G4733 Obstructive sleep apnea (adult) (pediatric): Secondary | ICD-10-CM

## 2020-05-11 NOTE — Telephone Encounter (Signed)
Called and spoke with patient she states she went to [facility] today and was given a new machine and they had it at 10 and it felt wonderful. Ramp starting at 5 but feels like she is smothering with it at 5 and is wondering if she can go to 10?    Dr. Annamaria Boots please advise

## 2020-05-11 NOTE — Telephone Encounter (Signed)
Ok to order [facility] to change CPAP setting to 10.

## 2020-05-12 NOTE — Telephone Encounter (Signed)
Left a voicemail on patient's phone regarding [facility] to change CPAP setting to 10.Advised patient order was sent to DME company. Nothing else further needed.

## 2020-06-27 ENCOUNTER — Ambulatory Visit: Payer: Medicare Other | Admitting: Internal Medicine

## 2020-07-06 ENCOUNTER — Other Ambulatory Visit: Payer: Self-pay | Admitting: Family Medicine

## 2020-09-10 DIAGNOSIS — G4733 Obstructive sleep apnea (adult) (pediatric): Secondary | ICD-10-CM | POA: Diagnosis not present

## 2020-09-21 DIAGNOSIS — Z1283 Encounter for screening for malignant neoplasm of skin: Secondary | ICD-10-CM | POA: Diagnosis not present

## 2020-09-21 DIAGNOSIS — B351 Tinea unguium: Secondary | ICD-10-CM | POA: Diagnosis not present

## 2020-09-21 DIAGNOSIS — L918 Other hypertrophic disorders of the skin: Secondary | ICD-10-CM | POA: Diagnosis not present

## 2020-09-21 DIAGNOSIS — L720 Epidermal cyst: Secondary | ICD-10-CM | POA: Diagnosis not present

## 2020-09-21 DIAGNOSIS — D225 Melanocytic nevi of trunk: Secondary | ICD-10-CM | POA: Diagnosis not present

## 2020-10-04 DIAGNOSIS — R350 Frequency of micturition: Secondary | ICD-10-CM | POA: Diagnosis not present

## 2020-10-04 DIAGNOSIS — M19012 Primary osteoarthritis, left shoulder: Secondary | ICD-10-CM | POA: Diagnosis not present

## 2020-10-04 DIAGNOSIS — M1711 Unilateral primary osteoarthritis, right knee: Secondary | ICD-10-CM | POA: Diagnosis not present

## 2020-10-07 DIAGNOSIS — R0981 Nasal congestion: Secondary | ICD-10-CM | POA: Diagnosis not present

## 2020-10-07 DIAGNOSIS — R059 Cough, unspecified: Secondary | ICD-10-CM | POA: Diagnosis not present

## 2020-10-07 DIAGNOSIS — U071 COVID-19: Secondary | ICD-10-CM | POA: Diagnosis not present

## 2020-10-07 DIAGNOSIS — B349 Viral infection, unspecified: Secondary | ICD-10-CM | POA: Diagnosis not present

## 2020-10-09 ENCOUNTER — Other Ambulatory Visit (HOSPITAL_COMMUNITY): Payer: Self-pay | Admitting: Family

## 2020-10-09 DIAGNOSIS — U071 COVID-19: Secondary | ICD-10-CM

## 2020-10-09 NOTE — Progress Notes (Signed)
I connected by phone with Iverson Alamin Zenz on 10/09/2020 at 12:41 PM to discuss the potential use of the antiviral REMDESIVIR for acute COVID-19 viral infection in non-hospitalized patients.   This patient is a 75 y.o. female that meets the FDA criteria for Emergency Use Authorization of [place].  Has a (+) direct SARS-CoV-2 viral test result  Has mild or moderate symptoms related to COVID-19  Is within 7 days of symptom onset  Has at least one of the high risk factor(s) for progression to severe COVID-19 and/or hospitalization as defined in NIH Guidelines and EUA.   Specific high risk criteria : Older age (>/= 76 yo), Immunosuppressive Disease or Treatment and Cardiovascular disease or hypertension   Symptoms of stuffy nose, cough, aches began 10/06/2021. She states she is fully vaccinated.    I have spoken and communicated the following to the patient or parent/caregiver regarding COVID-19 IV Antiviral treatment:  1. FDA has authorized the emergency use for the treatment of mild to moderate COVID-19 in adults and pediatric patients with positive results of SARS-CoV-2 testing who are 53 years of age and older weighing at least 40 kg, and who are at high risk for progressing to severe COVID-19 and/or hospitalization.  2. The significant known and potential risks and benefits in receiving REMDESIVIR in accordance with current NIH treatment guidelines.   3. Information on available alternative treatments and the risks and benefits of those alternatives, including clinical trials that may be accessible to the patient.   4. Patients treated with antiviral therapy should continue to isolate and use infection control measures (e.g., wear mask, isolate, social distance, avoid sharing personal items, clean and disinfect "high touch" surfaces, and frequent handwashing) according to CDC guidelines.   5. The patient or parent/caregiver has the option to accept or refuse REMDESIVIR therapy and  has had the opportunity to have all questions addressed prior to consenting for treatment.    After reviewing this information with the patient, he/she has decided to proceed with the 3 day course of treatment. No my chart message sent as patient does not have my chart. Did discuss medication and appointment details and phone number to clinic provided.   Asencion Gowda, NP 10/09/2020  12:41 PM

## 2020-10-10 ENCOUNTER — Ambulatory Visit (HOSPITAL_COMMUNITY)
Admission: RE | Admit: 2020-10-10 | Discharge: 2020-10-10 | Disposition: A | Discharge status: Home / Self Care | Payer: Medicare Other | Source: Ambulatory Visit | Attending: Pulmonary Disease | Admitting: Pulmonary Disease

## 2020-10-10 DIAGNOSIS — U071 COVID-19: Secondary | ICD-10-CM | POA: Insufficient documentation

## 2020-10-10 MED ORDER — SODIUM CHLORIDE 0.9 % IV SOLN
100.0000 mg | Freq: Once | INTRAVENOUS | Status: AC
  Administered 2020-10-10: 100 mg via INTRAVENOUS

## 2020-10-10 MED ORDER — METHYLPREDNISOLONE SODIUM SUCC 125 MG IJ SOLR: 125.0000 mg | Freq: Once | INTRAMUSCULAR | Status: DC | PRN

## 2020-10-10 MED ORDER — FAMOTIDINE IN NACL 20-0.9 MG/50ML-% IV SOLN: 20.0000 mg | Freq: Once | INTRAVENOUS | Status: DC | PRN

## 2020-10-10 MED ORDER — DIPHENHYDRAMINE HCL 50 MG/ML IJ SOLN: 50.0000 mg | Freq: Once | INTRAMUSCULAR | Status: DC | PRN

## 2020-10-10 MED ORDER — ALBUTEROL SULFATE HFA 108 (90 BASE) MCG/ACT IN AERS: 2.0000 | INHALATION_SPRAY | Freq: Once | RESPIRATORY_TRACT | Status: DC | PRN

## 2020-10-10 MED ORDER — SODIUM CHLORIDE 0.9 % IV SOLN: INTRAVENOUS | Status: DC | PRN

## 2020-10-10 MED ORDER — EPINEPHRINE 0.3 MG/0.3ML IJ SOAJ: 0.3000 mg | Freq: Once | INTRAMUSCULAR | Status: DC | PRN

## 2020-10-10 NOTE — Progress Notes (Signed)
Patient reviewed Fact Sheet for Patients, Parents, and Caregivers for Emergency Use Authorization (EUA) of remdesivir for the Treatment of Coronavirus. Patient also reviewed and is agreeable to the estimated cost of treatment. Patient is agreeable to proceed.    

## 2020-10-10 NOTE — Progress Notes (Signed)
  Diagnosis: COVID-19  Physician: Dr. Asencion Noble  Procedure: [facility] Med: remdesivir infusion - Provided patient with remdesivir fact sheet for patients, parents and caregivers prior to infusion.  Complications: No immediate complications noted.  Discharge: Discharged home   Janine Ores 10/10/2020

## 2020-10-10 NOTE — Discharge Instructions (Signed)
10 Things You Can Do to Manage Your COVID-19 Symptoms at Home °If you have possible or confirmed COVID-19: °1. Stay home except to get medical care. °2. Monitor your symptoms carefully. If your symptoms get worse, call your healthcare provider immediately. °3. Get rest and stay hydrated. °4. If you have a medical appointment, call the healthcare provider ahead of time and tell them that you have or may have COVID-19. °5. For medical emergencies, call 911 and notify the dispatch personnel that you have or may have COVID-19. °6. Cover your cough and sneezes with a tissue or use the inside of your elbow. °7. Wash your hands often with soap and water for at least 20 seconds or clean your hands with an alcohol-based hand sanitizer that contains at least 60% alcohol. °8. As much as possible, stay in a specific room and away from other people in your home. Also, you should use a separate bathroom, if available. If you need to be around other people in or outside of the home, wear a mask. °9. Avoid sharing personal items with other people in your household, like dishes, towels, and bedding. °10. Clean all surfaces that are touched often, like counters, tabletops, and doorknobs. Use household cleaning sprays or wipes according to the label instructions. °cdc.gov/coronavirus °03/11/2020 °This information is not intended to replace advice given to you by your health care provider. Make sure you discuss any questions you have with your health care provider. °Document Revised: 06/27/2020 Document Reviewed: 06/27/2020 °Elsevier Patient Education © 2021 Elsevier Inc. °If you have any questions or concerns after the infusion please call the Advanced Practice Provider on call at [phone]. This number is ONLY intended for your use regarding questions or concerns about the infusion post-treatment side-effects.  Please do not provide this number to others for use. For return to work notes please contact your primary care provider.   ° °If someone you know is interested in receiving treatment please have them contact their MD for a referral or visit [email or web address] ° ° ° °

## 2020-10-11 ENCOUNTER — Ambulatory Visit (HOSPITAL_COMMUNITY)
Admission: RE | Admit: 2020-10-11 | Discharge: 2020-10-11 | Disposition: A | Discharge status: Home / Self Care | Payer: Medicare Other | Source: Ambulatory Visit | Attending: Pulmonary Disease | Admitting: Pulmonary Disease

## 2020-10-11 DIAGNOSIS — U071 COVID-19: Secondary | ICD-10-CM

## 2020-10-11 DIAGNOSIS — G4733 Obstructive sleep apnea (adult) (pediatric): Secondary | ICD-10-CM | POA: Diagnosis not present

## 2020-10-11 MED ORDER — EPINEPHRINE 0.3 MG/0.3ML IJ SOAJ: 0.3000 mg | Freq: Once | INTRAMUSCULAR | Status: DC | PRN

## 2020-10-11 MED ORDER — SODIUM CHLORIDE 0.9 % IV SOLN: INTRAVENOUS | Status: DC | PRN

## 2020-10-11 MED ORDER — METHYLPREDNISOLONE SODIUM SUCC 125 MG IJ SOLR: 125.0000 mg | Freq: Once | INTRAMUSCULAR | Status: DC | PRN

## 2020-10-11 MED ORDER — SODIUM CHLORIDE 0.9 % IV SOLN
100.0000 mg | Freq: Once | INTRAVENOUS | Status: AC
  Administered 2020-10-11: 100 mg via INTRAVENOUS

## 2020-10-11 MED ORDER — DIPHENHYDRAMINE HCL 50 MG/ML IJ SOLN: 50.0000 mg | Freq: Once | INTRAMUSCULAR | Status: DC | PRN

## 2020-10-11 MED ORDER — FAMOTIDINE IN NACL 20-0.9 MG/50ML-% IV SOLN: 20.0000 mg | Freq: Once | INTRAVENOUS | Status: DC | PRN

## 2020-10-11 MED ORDER — ALBUTEROL SULFATE HFA 108 (90 BASE) MCG/ACT IN AERS: 2.0000 | INHALATION_SPRAY | Freq: Once | RESPIRATORY_TRACT | Status: DC | PRN

## 2020-10-11 NOTE — Discharge Instructions (Signed)
10 Things You Can Do to Manage Your COVID-19 Symptoms at Home °If you have possible or confirmed COVID-19: °1. Stay home except to get medical care. °2. Monitor your symptoms carefully. If your symptoms get worse, call your healthcare provider immediately. °3. Get rest and stay hydrated. °4. If you have a medical appointment, call the healthcare provider ahead of time and tell them that you have or may have COVID-19. °5. For medical emergencies, call 911 and notify the dispatch personnel that you have or may have COVID-19. °6. Cover your cough and sneezes with a tissue or use the inside of your elbow. °7. Wash your hands often with soap and water for at least 20 seconds or clean your hands with an alcohol-based hand sanitizer that contains at least 60% alcohol. °8. As much as possible, stay in a specific room and away from other people in your home. Also, you should use a separate bathroom, if available. If you need to be around other people in or outside of the home, wear a mask. °9. Avoid sharing personal items with other people in your household, like dishes, towels, and bedding. °10. Clean all surfaces that are touched often, like counters, tabletops, and doorknobs. Use household cleaning sprays or wipes according to the label instructions. °cdc.gov/coronavirus °03/11/2020 °This information is not intended to replace advice given to you by your health care provider. Make sure you discuss any questions you have with your health care provider. °Document Revised: 06/27/2020 Document Reviewed: 06/27/2020 °Elsevier Patient Education © 2021 Elsevier Inc. °If you have any questions or concerns after the infusion please call the Advanced Practice Provider on call at [phone]. This number is ONLY intended for your use regarding questions or concerns about the infusion post-treatment side-effects.  Please do not provide this number to others for use. For return to work notes please contact your primary care provider.   ° °If someone you know is interested in receiving treatment please have them contact their MD for a referral or visit [email or web address] ° ° ° °

## 2020-10-12 ENCOUNTER — Ambulatory Visit (HOSPITAL_COMMUNITY)
Admission: RE | Admit: 2020-10-12 | Discharge: 2020-10-12 | Disposition: A | Discharge status: Home / Self Care | Payer: Medicare Other | Source: Ambulatory Visit | Attending: Pulmonary Disease | Admitting: Pulmonary Disease

## 2020-10-12 DIAGNOSIS — U071 COVID-19: Secondary | ICD-10-CM

## 2020-10-12 MED ORDER — FAMOTIDINE IN NACL 20-0.9 MG/50ML-% IV SOLN: 20.0000 mg | Freq: Once | INTRAVENOUS | Status: DC | PRN

## 2020-10-12 MED ORDER — METHYLPREDNISOLONE SODIUM SUCC 125 MG IJ SOLR: 125.0000 mg | Freq: Once | INTRAMUSCULAR | Status: DC | PRN

## 2020-10-12 MED ORDER — SODIUM CHLORIDE 0.9 % IV SOLN
100.0000 mg | Freq: Once | INTRAVENOUS | Status: AC
  Administered 2020-10-12: 100 mg via INTRAVENOUS

## 2020-10-12 MED ORDER — ALBUTEROL SULFATE HFA 108 (90 BASE) MCG/ACT IN AERS: 2.0000 | INHALATION_SPRAY | Freq: Once | RESPIRATORY_TRACT | Status: DC | PRN

## 2020-10-12 MED ORDER — EPINEPHRINE 0.3 MG/0.3ML IJ SOAJ: 0.3000 mg | Freq: Once | INTRAMUSCULAR | Status: DC | PRN

## 2020-10-12 MED ORDER — DIPHENHYDRAMINE HCL 50 MG/ML IJ SOLN: 50.0000 mg | Freq: Once | INTRAMUSCULAR | Status: DC | PRN

## 2020-10-12 MED ORDER — SODIUM CHLORIDE 0.9 % IV SOLN: INTRAVENOUS | Status: DC | PRN

## 2020-10-12 NOTE — Progress Notes (Deleted)
Patient reviewed Fact Sheet for Patients, Parents, and Caregivers for Emergency Use Authorization (EUA) of sotrovimab for the Treatment of Coronavirus. Patient also reviewed and is agreeable to the estimated cost of treatment. Patient is agreeable to proceed.   

## 2020-10-12 NOTE — Progress Notes (Signed)
  Diagnosis: COVID-19  Physician:dr wright  Procedure: [facility] Med: remdesivir infusion - Provided patient with remdesivir fact sheet for patients, parents and caregivers prior to infusion.  Complications: No immediate complications noted.  Discharge: Discharged home   [place] S Manus Weedman 10/12/2020

## 2020-10-12 NOTE — Discharge Instructions (Signed)
10 Things You Can Do to Manage Your COVID-19 Symptoms at Home °If you have possible or confirmed COVID-19: °1. Stay home except to get medical care. °2. Monitor your symptoms carefully. If your symptoms get worse, call your healthcare provider immediately. °3. Get rest and stay hydrated. °4. If you have a medical appointment, call the healthcare provider ahead of time and tell them that you have or may have COVID-19. °5. For medical emergencies, call 911 and notify the dispatch personnel that you have or may have COVID-19. °6. Cover your cough and sneezes with a tissue or use the inside of your elbow. °7. Wash your hands often with soap and water for at least 20 seconds or clean your hands with an alcohol-based hand sanitizer that contains at least 60% alcohol. °8. As much as possible, stay in a specific room and away from other people in your home. Also, you should use a separate bathroom, if available. If you need to be around other people in or outside of the home, wear a mask. °9. Avoid sharing personal items with other people in your household, like dishes, towels, and bedding. °10. Clean all surfaces that are touched often, like counters, tabletops, and doorknobs. Use household cleaning sprays or wipes according to the label instructions. °cdc.gov/coronavirus °03/11/2020 °This information is not intended to replace advice given to you by your health care provider. Make sure you discuss any questions you have with your health care provider. °Document Revised: 06/27/2020 Document Reviewed: 06/27/2020 °Elsevier Patient Education © 2021 Elsevier Inc. °If you have any questions or concerns after the infusion please call the Advanced Practice Provider on call at [phone]. This number is ONLY intended for your use regarding questions or concerns about the infusion post-treatment side-effects.  Please do not provide this number to others for use. For return to work notes please contact your primary care provider.   ° °If someone you know is interested in receiving treatment please have them contact their MD for a referral or visit [email or web address] ° ° ° °

## 2020-10-20 DIAGNOSIS — M6281 Muscle weakness (generalized): Secondary | ICD-10-CM | POA: Diagnosis not present

## 2020-10-24 DIAGNOSIS — G4733 Obstructive sleep apnea (adult) (pediatric): Secondary | ICD-10-CM | POA: Diagnosis not present

## 2020-11-08 DIAGNOSIS — G4733 Obstructive sleep apnea (adult) (pediatric): Secondary | ICD-10-CM | POA: Diagnosis not present

## 2020-11-09 DIAGNOSIS — M6281 Muscle weakness (generalized): Secondary | ICD-10-CM | POA: Diagnosis not present

## 2020-11-09 DIAGNOSIS — R350 Frequency of micturition: Secondary | ICD-10-CM | POA: Diagnosis not present

## 2020-11-14 DIAGNOSIS — D3613 Benign neoplasm of peripheral nerves and autonomic nervous system of lower limb, including hip: Secondary | ICD-10-CM | POA: Diagnosis not present

## 2020-11-14 DIAGNOSIS — L720 Epidermal cyst: Secondary | ICD-10-CM | POA: Diagnosis not present

## 2020-11-14 DIAGNOSIS — D2371 Other benign neoplasm of skin of right lower limb, including hip: Secondary | ICD-10-CM | POA: Diagnosis not present

## 2020-11-14 DIAGNOSIS — L905 Scar conditions and fibrosis of skin: Secondary | ICD-10-CM | POA: Diagnosis not present

## 2020-11-15 DIAGNOSIS — K640 First degree hemorrhoids: Secondary | ICD-10-CM | POA: Diagnosis not present

## 2020-11-15 DIAGNOSIS — K5730 Diverticulosis of large intestine without perforation or abscess without bleeding: Secondary | ICD-10-CM | POA: Diagnosis not present

## 2020-11-15 DIAGNOSIS — K5130 Ulcerative (chronic) rectosigmoiditis without complications: Secondary | ICD-10-CM | POA: Diagnosis not present

## 2020-11-15 DIAGNOSIS — R12 Heartburn: Secondary | ICD-10-CM | POA: Diagnosis not present

## 2020-11-15 DIAGNOSIS — K2101 Gastro-esophageal reflux disease with esophagitis, with bleeding: Secondary | ICD-10-CM | POA: Diagnosis not present

## 2020-11-21 DIAGNOSIS — H52223 Regular astigmatism, bilateral: Secondary | ICD-10-CM | POA: Diagnosis not present

## 2020-11-21 DIAGNOSIS — H5203 Hypermetropia, bilateral: Secondary | ICD-10-CM | POA: Diagnosis not present

## 2020-11-21 DIAGNOSIS — H02834 Dermatochalasis of left upper eyelid: Secondary | ICD-10-CM | POA: Diagnosis not present

## 2020-11-21 DIAGNOSIS — H16223 Keratoconjunctivitis sicca, not specified as Sjogren's, bilateral: Secondary | ICD-10-CM | POA: Diagnosis not present

## 2020-11-21 DIAGNOSIS — E119 Type 2 diabetes mellitus without complications: Secondary | ICD-10-CM | POA: Diagnosis not present

## 2020-11-21 DIAGNOSIS — H524 Presbyopia: Secondary | ICD-10-CM | POA: Diagnosis not present

## 2020-11-21 DIAGNOSIS — H02831 Dermatochalasis of right upper eyelid: Secondary | ICD-10-CM | POA: Diagnosis not present

## 2020-11-21 DIAGNOSIS — Z7984 Long term (current) use of oral hypoglycemic drugs: Secondary | ICD-10-CM | POA: Diagnosis not present

## 2020-11-21 DIAGNOSIS — H43813 Vitreous degeneration, bilateral: Secondary | ICD-10-CM | POA: Diagnosis not present

## 2020-11-21 DIAGNOSIS — Z961 Presence of intraocular lens: Secondary | ICD-10-CM | POA: Diagnosis not present

## 2020-11-21 DIAGNOSIS — H26491 Other secondary cataract, right eye: Secondary | ICD-10-CM | POA: Diagnosis not present

## 2020-11-23 ENCOUNTER — Other Ambulatory Visit: Payer: Self-pay | Admitting: Family Medicine

## 2020-11-23 DIAGNOSIS — M6281 Muscle weakness (generalized): Secondary | ICD-10-CM | POA: Diagnosis not present

## 2020-11-23 DIAGNOSIS — Z1231 Encounter for screening mammogram for malignant neoplasm of breast: Secondary | ICD-10-CM

## 2020-11-25 DIAGNOSIS — M1711 Unilateral primary osteoarthritis, right knee: Secondary | ICD-10-CM | POA: Diagnosis not present

## 2020-12-09 DIAGNOSIS — G4733 Obstructive sleep apnea (adult) (pediatric): Secondary | ICD-10-CM | POA: Diagnosis not present

## 2020-12-22 DIAGNOSIS — M25512 Pain in left shoulder: Secondary | ICD-10-CM | POA: Diagnosis not present

## 2020-12-22 DIAGNOSIS — M6281 Muscle weakness (generalized): Secondary | ICD-10-CM | POA: Diagnosis not present

## 2021-01-03 DIAGNOSIS — M19012 Primary osteoarthritis, left shoulder: Secondary | ICD-10-CM | POA: Diagnosis not present

## 2021-01-08 DIAGNOSIS — G4733 Obstructive sleep apnea (adult) (pediatric): Secondary | ICD-10-CM | POA: Diagnosis not present

## 2021-01-18 DIAGNOSIS — M25512 Pain in left shoulder: Secondary | ICD-10-CM | POA: Diagnosis not present

## 2021-01-18 DIAGNOSIS — M6281 Muscle weakness (generalized): Secondary | ICD-10-CM | POA: Diagnosis not present

## 2021-01-18 DIAGNOSIS — R350 Frequency of micturition: Secondary | ICD-10-CM | POA: Diagnosis not present

## 2021-02-08 DIAGNOSIS — G4733 Obstructive sleep apnea (adult) (pediatric): Secondary | ICD-10-CM | POA: Diagnosis not present

## 2021-02-13 DIAGNOSIS — G4733 Obstructive sleep apnea (adult) (pediatric): Secondary | ICD-10-CM | POA: Diagnosis not present

## 2021-02-21 DIAGNOSIS — M6281 Muscle weakness (generalized): Secondary | ICD-10-CM | POA: Diagnosis not present

## 2021-03-01 ENCOUNTER — Ambulatory Visit (INDEPENDENT_AMBULATORY_CARE_PROVIDER_SITE_OTHER): Payer: Medicare Other

## 2021-03-01 ENCOUNTER — Other Ambulatory Visit: Payer: Self-pay

## 2021-03-01 ENCOUNTER — Encounter: Payer: Self-pay | Admitting: Podiatrist

## 2021-03-01 ENCOUNTER — Other Ambulatory Visit: Payer: Self-pay | Admitting: Podiatrist

## 2021-03-01 ENCOUNTER — Ambulatory Visit: Payer: Medicare Other | Admitting: Podiatrist

## 2021-03-01 DIAGNOSIS — L02611 Cutaneous abscess of right foot: Secondary | ICD-10-CM | POA: Diagnosis not present

## 2021-03-01 DIAGNOSIS — M109 Gout, unspecified: Secondary | ICD-10-CM

## 2021-03-01 DIAGNOSIS — M79671 Pain in right foot: Secondary | ICD-10-CM

## 2021-03-01 DIAGNOSIS — L03031 Cellulitis of right toe: Secondary | ICD-10-CM | POA: Diagnosis not present

## 2021-03-01 NOTE — Patient Instructions (Signed)
I will call with the result of the lab tests for gout.

## 2021-03-02 LAB — CBC WITH DIFFERENTIAL/PLATELET
Absolute Monocytes: 874 cells/uL (ref 200–950)
Basophils Absolute: 64 cells/uL (ref 0–200)
Basophils Relative: 0.7 %
Eosinophils Absolute: 273 cells/uL (ref 15–500)
Eosinophils Relative: 3 %
HCT: 36 % (ref 35.0–45.0)
Hemoglobin: 12.1 g/dL (ref 11.7–15.5)
Lymphs Abs: 2421 cells/uL (ref 850–3900)
MCH: 31.6 pg (ref 27.0–33.0)
MCHC: 33.6 g/dL (ref 32.0–36.0)
MCV: 94 fL (ref 80.0–100.0)
MPV: 10.3 fL (ref 7.5–12.5)
Monocytes Relative: 9.6 %
Neutro Abs: 5469 cells/uL (ref 1500–7800)
Neutrophils Relative %: 60.1 %
Platelets: 292 10*3/uL (ref 140–400)
RBC: 3.83 10*6/uL (ref 3.80–5.10)
RDW: 13.1 % (ref 11.0–15.0)
Total Lymphocyte: 26.6 %
WBC: 9.1 10*3/uL (ref 3.8–10.8)

## 2021-03-02 LAB — SEDIMENTATION RATE: Sed Rate: 22 mm/h (ref 0–30)

## 2021-03-02 LAB — URIC ACID: Uric Acid, Serum: 3 mg/dL (ref 2.5–7.0)

## 2021-03-02 MED ORDER — AMOXICILLIN-POT CLAVULANATE 875-125 MG PO TABS: 1.0000 | ORAL_TABLET | Freq: Two times a day (BID) | ORAL | 0 refills | Status: DC

## 2021-03-02 NOTE — Progress Notes (Signed)
Chief Complaint  Patient presents with   Pain    Pain and swelling at Rt 5th toe x last Thurs. No injury -wrose with shoes -=better with sandals no numbness Tx: elevation, tylenol and IBU      HPI: Patient is 75 y.o. female who presents today for the concerns as listed above.  Her toe is red and swollen and she recalls no injury to the toe.  It has been this way for a week.  She has been checked for gout in the past and the labs were normal-  she wonders if she may have had a bug bite.    Patient Active Problem List   Diagnosis Date Noted   Glenohumeral arthritis, left 08/11/2017   Acute upper respiratory infection 09/07/2015   Obstructive sleep apnea 09/13/2008   Essential hypertension 03/01/2008   Seasonal and perennial allergic rhinitis 03/01/2008    Current Outpatient Medications on File Prior to Visit  Medication Sig Dispense Refill   acetaminophen (TYLENOL) 650 MG CR tablet Take 650 mg by mouth every 8 (eight) hours as needed for pain.     alendronate (FOSAMAX) 70 MG tablet Take 70 mg by mouth once a week.      Ascorbic Acid (VITAMIN C) 1000 MG tablet Take 1,000 mg by mouth every evening.     atorvastatin (LIPITOR) 10 MG tablet Take 10 mg by mouth every evening.      B Complex-C (B-COMPLEX WITH VITAMIN C) tablet Take 1 tablet by mouth daily.     Calcium & Magnesium Carbonates (MYLANTA PO) Take 30 mLs by mouth daily as needed (constipation).      calcium citrate-vitamin D (CITRACAL+D) 315-200 MG-UNIT tablet Take 1 tablet by mouth 2 (two) times daily.     Cholecalciferol (VITAMIN D) 2000 units CAPS Take 2,000 Units by mouth 2 (two) times daily.      Famotidine (PEPCID PO) Take 1 tablet by mouth daily as needed (heartburn).      fexofenadine (ALLEGRA) 180 MG tablet Take 90 mg by mouth every evening. Takes 1/2-1 tablet daily.     gabapentin (NEURONTIN) 100 MG capsule Take 1 to 3 capsules twice daily as needed.     glucosamine-chondroitin 500-400 MG tablet Take 1 tablet by mouth  daily.     losartan-hydrochlorothiazide (HYZAAR) 100-12.5 MG per tablet Take 1 tablet by mouth daily.      Melatonin 5 MG TABS Take 1 tablet by mouth at bedtime.      mesalamine (APRISO) 0.375 g 24 hr capsule Take 1.5 g by mouth every morning.     metFORMIN (GLUCOPHAGE) 500 MG tablet Take 500 mg by mouth 2 (two) times daily with a meal.      Multiple Vitamins-Minerals (MULTIVITAMIN WITH MINERALS) tablet Take 1 tablet by mouth daily.     traMADol (ULTRAM) 50 MG tablet Take 1 tablet (50 mg total) by mouth every 6 (six) hours as needed. (Patient not taking: Reported on 04/12/2019) 40 tablet 0   Triamcinolone Acetonide (NASACORT ALLERGY 24HR NA) Place 2 sprays into the nose at bedtime as needed (allergies).      TURMERIC PO Take 1 capsule by mouth daily.     zolpidem (AMBIEN) 5 MG tablet Take 5 mg by mouth at bedtime as needed for sleep.     No current facility-administered medications on file prior to visit.    Allergies  Allergen Reactions   Pollen Extract Other (See Comments)    Headache and Nasal Congestion.    Review of  Systems No fevers, chills, nausea, muscle aches, no difficulty breathing, no calf pain, no chest pain or shortness of breath.   Physical Exam  GENERAL APPEARANCE: Alert, conversant. Appropriately groomed. No acute distress.   VASCULAR: Pedal pulses palpable DP and PT bilateral.  Capillary refill time is immediate to all digits,  Proximal to distal cooling it warm to warm.  Digital perfusion adequate.   NEUROLOGIC: sensation is intact to 5.07 monofilament at 5/5 sites bilateral.  Light touch is intact bilateral, vibratory sensation intact bilateral  MUSCULOSKELETAL: acceptable muscle strength, tone and stability bilateral.  Rednes and swelling of the right fifth digit noted.  Some tenderness to palpation noted.   DERMATOLOGIC: skin is warm, supple, and dry.  No open lesions noted.  No rash, no pre ulcerative lesions. Digital nails are asymptomatic.  -  no sign of  ingrown nail noted right fifth digit.  No open lesion noted.  No entry wound for a bite identified.  Toe itself is red, swollen and painful  Xrays reveal no sign of fracture or dislocation.  Swelling around the digit is seen in the soft tissues.      Assessment     ICD-10-CM   1. Gout of right foot, unspecified cause, unspecified chronicity  M10.9 Uric acid    Sedimentation rate    CBC with Differential/Platelet    2. Right foot pain  M79.671 CANCELED: DG Foot Complete Right    3. Cellulitis and abscess of toe of right foot  L03.031    L02.611        Plan  Discussed exam and xray findings with the patient.  Due to the sudden onset with no injury, I am thinking it could be a gout flare or inflammation within the toe.    I infiltrated dexamethasone and lidocaine into the proximal portion of the toe to try and reduce the inflammation  I ordered uric acid, sed rate, and cbc with diff to rule out gout.  I will call her with the result of the labs.

## 2021-03-07 ENCOUNTER — Other Ambulatory Visit: Payer: Self-pay

## 2021-03-07 ENCOUNTER — Telehealth: Payer: Self-pay | Admitting: *Deleted

## 2021-03-07 ENCOUNTER — Ambulatory Visit: Payer: Medicare Other | Admitting: Podiatry

## 2021-03-07 DIAGNOSIS — G479 Sleep disorder, unspecified: Secondary | ICD-10-CM | POA: Diagnosis not present

## 2021-03-07 DIAGNOSIS — M79671 Pain in right foot: Secondary | ICD-10-CM | POA: Diagnosis not present

## 2021-03-07 DIAGNOSIS — I10 Essential (primary) hypertension: Secondary | ICD-10-CM | POA: Diagnosis not present

## 2021-03-07 DIAGNOSIS — L03031 Cellulitis of right toe: Secondary | ICD-10-CM

## 2021-03-07 DIAGNOSIS — M810 Age-related osteoporosis without current pathological fracture: Secondary | ICD-10-CM | POA: Diagnosis not present

## 2021-03-07 DIAGNOSIS — M109 Gout, unspecified: Secondary | ICD-10-CM | POA: Diagnosis not present

## 2021-03-07 DIAGNOSIS — R7303 Prediabetes: Secondary | ICD-10-CM | POA: Diagnosis not present

## 2021-03-07 DIAGNOSIS — L02611 Cutaneous abscess of right foot: Secondary | ICD-10-CM

## 2021-03-07 DIAGNOSIS — E7800 Pure hypercholesterolemia, unspecified: Secondary | ICD-10-CM | POA: Diagnosis not present

## 2021-03-07 DIAGNOSIS — Z0000 Encounter for general adult medical examination without abnormal findings: Secondary | ICD-10-CM | POA: Diagnosis not present

## 2021-03-07 NOTE — Progress Notes (Signed)
  Subjective:  Patient ID: [name] Finley, female    DOB: 1946-04-21,  MRN: [id number]  Chief Complaint  Patient presents with   Toe Pain    Right 5th toe inflammation. Pt was seen by Dr. Valentina Lucks last week. Pt states some improvement but has some redness.    75 y.o. female presents with the above complaint. History confirmed with patient.   Objective:  Physical Exam: warm, good capillary refill, no trophic changes or ulcerative lesions, normal DP and PT pulses, and normal sensory exam.  Right Foot: 5th toe with scant edema, no erythema; POP about the proximal phalanx. No crepitus   Assessment:   1. Right foot pain   2. Gout of right foot, unspecified cause, unspecified chronicity   3. Cellulitis and abscess of toe of right foot    Plan:  Patient was evaluated and treated and all questions answered.  Right 5th toe pain; possible fracture -Prior XR reviewed -Labs reviewed. No evidence of gout or infection -Possible cortical irregularity lateral base 5th proximal phalanx, poss representing fracture -Overall the toe is improving. If pain persists consider new Xrs.  Return in about 3 weeks (around 03/28/2021) for toe pain f/u.

## 2021-03-21 ENCOUNTER — Other Ambulatory Visit (HOSPITAL_COMMUNITY): Payer: Self-pay | Admitting: *Deleted

## 2021-03-22 ENCOUNTER — Ambulatory Visit (HOSPITAL_COMMUNITY): Payer: Medicare Other

## 2021-03-23 ENCOUNTER — Ambulatory Visit (HOSPITAL_COMMUNITY)
Admission: RE | Admit: 2021-03-23 | Discharge: 2021-03-23 | Disposition: A | Discharge status: Home / Self Care | Payer: Medicare Other | Source: Ambulatory Visit | Attending: Family Medicine | Admitting: Family Medicine

## 2021-03-23 ENCOUNTER — Other Ambulatory Visit: Payer: Self-pay

## 2021-03-23 DIAGNOSIS — M810 Age-related osteoporosis without current pathological fracture: Secondary | ICD-10-CM | POA: Diagnosis not present

## 2021-03-23 MED ORDER — ZOLEDRONIC ACID 5 MG/100ML IV SOLN
INTRAVENOUS | Status: AC
  Filled 2021-03-23: qty 100

## 2021-03-23 MED ORDER — ZOLEDRONIC ACID 5 MG/100ML IV SOLN
5.0000 mg | Freq: Once | INTRAVENOUS | Status: AC
  Administered 2021-03-23: 5 mg via INTRAVENOUS

## 2021-03-27 ENCOUNTER — Ambulatory Visit: Payer: Medicare Other

## 2021-03-28 ENCOUNTER — Ambulatory Visit (INDEPENDENT_AMBULATORY_CARE_PROVIDER_SITE_OTHER): Payer: Medicare Other | Admitting: Podiatry

## 2021-03-28 DIAGNOSIS — Z5329 Procedure and treatment not carried out because of patient's decision for other reasons: Secondary | ICD-10-CM

## 2021-04-04 ENCOUNTER — Ambulatory Visit: Payer: Medicare Other | Admitting: Podiatry

## 2021-04-04 ENCOUNTER — Other Ambulatory Visit: Payer: Self-pay

## 2021-04-04 VITALS — Temp 98.2°F

## 2021-04-04 DIAGNOSIS — L03031 Cellulitis of right toe: Secondary | ICD-10-CM | POA: Diagnosis not present

## 2021-04-04 DIAGNOSIS — L02611 Cutaneous abscess of right foot: Secondary | ICD-10-CM | POA: Diagnosis not present

## 2021-04-04 DIAGNOSIS — L603 Nail dystrophy: Secondary | ICD-10-CM | POA: Diagnosis not present

## 2021-04-04 DIAGNOSIS — M109 Gout, unspecified: Secondary | ICD-10-CM

## 2021-04-04 NOTE — Progress Notes (Signed)
  Subjective:  Patient ID: [name] Finley, female    DOB: November 03, 1945,  MRN: [id number]  Chief Complaint  Patient presents with   Gout    Right 5th toe red and slightly swollen at this time. Patient denies nausea, vomiting, fever and chills.    75 y.o. female presents with the above complaint. History confirmed with patient.   Objective:  Physical Exam: warm, good capillary refill, no trophic changes or ulcerative lesions, normal DP and PT pulses, and normal sensory exam.  Right Foot: 5th toe with scant edema, no erythema; no POP about the proximal phalanx. No crepitus   Assessment:   1. Cellulitis and abscess of toe of right foot   2. Gout of right foot, unspecified cause, unspecified chronicity   3. Nail dystrophy    Plan:  Patient was evaluated and treated and all questions answered.  Right 5th toe pain; possible fracture -Appears resolved, no indication for new XR today  Nail dystrophy -Discussed nail changes are due to dystrophy rather than fungus. No medications advised at this time. Ok to paint nails.  Return if symptoms worsen or fail to improve.

## 2021-04-14 DIAGNOSIS — R350 Frequency of micturition: Secondary | ICD-10-CM | POA: Diagnosis not present

## 2021-04-21 NOTE — Progress Notes (Signed)
Subjective:    Patient ID: [name] Finley, female    DOB: 14-Jun-1946, 75 y.o.   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 Was treated with MAB IV.  Replaced CPAP and changed to [facility] Nasal congestion treated with Nasacort.  ROS-see HPI   + = positive Constitutional:    weight loss, night sweats, fevers, chills, fatigue, lassitude. HEENT:    headaches, difficulty swallowing, tooth/dental problems, sore throat,       sneezing, itching, ear ache, + nasal congestion, post nasal drip, snoring CV:    chest pain, orthopnea, PND, swelling in lower extremities, anasarca,                                               dizziness, palpitations Resp:   shortness of breath with exertion or at rest.                 productive cough,   non-productive cough, coughing up of blood.               change in color of mucus.  wheezing.   Skin:    rash or lesions. GI:  No-   heartburn, indigestion, abdominal pain, nausea,  vomiting, diarrhea,                 change in bowel habits, loss of appetite GU: dysuria, change in color of urine, no urgency or frequency.   flank pain. MS:   joint pain, stiffness, decreased range of motion, back pain. Neuro-     nothing unusual Psych:  change in mood or affect.  depression or anxiety.   memory loss.  .    Objective:  OBJ- Physical Exam General- Alert, Oriented, Affect-appropriate, Distress- none acute, + obese Skin- rash-none, lesions- none, excoriation- none Lymphadenopathy- none Head- atraumatic            Eyes- Gross vision intact, PERRLA, conjunctivae and secretions clear            Ears- Hearing, canals-normal            Nose- Clear, no-Septal dev, mucus, polyps, erosion, perforation            Throat- Mallampati II , mucosa clear , drainage- none, tonsils- atrophic Neck- flexible , trachea midline, no stridor , thyroid nl, carotid no bruit Chest - symmetrical excursion , unlabored           Heart/CV- RRR , no murmur ,  no gallop  , no rub, nl s1 s2                           - JVD- none , edema- none, stasis changes- none, varices- none           Lung- clear to P&A, wheeze- none, cough- none , dullness-none, rub- none           Chest wall-  Abd-  Br/ Gen/ Rectal- Not done, not indicated Extrem- cyanosis- none, clubbing, none, atrophy- none, strength- nl Neuro- grossly intact to observation    Assessment & Plan:

## 2021-04-21 NOTE — Telephone Encounter (Signed)
error 

## 2021-04-24 ENCOUNTER — Other Ambulatory Visit: Payer: Self-pay

## 2021-04-24 ENCOUNTER — Ambulatory Visit (INDEPENDENT_AMBULATORY_CARE_PROVIDER_SITE_OTHER): Payer: Medicare Other

## 2021-04-24 ENCOUNTER — Encounter: Payer: Self-pay | Admitting: Internal Medicine

## 2021-04-24 ENCOUNTER — Ambulatory Visit: Payer: Medicare Other | Admitting: Internal Medicine

## 2021-04-24 VITALS — BP 132/78 | HR 74 | Temp 97.6°F | Ht 61.0 in | Wt 217.0 lb

## 2021-04-24 DIAGNOSIS — G4733 Obstructive sleep apnea (adult) (pediatric): Secondary | ICD-10-CM | POA: Diagnosis not present

## 2021-04-24 DIAGNOSIS — J3089 Other allergic rhinitis: Secondary | ICD-10-CM

## 2021-04-24 DIAGNOSIS — R059 Cough, unspecified: Secondary | ICD-10-CM | POA: Diagnosis not present

## 2021-04-24 DIAGNOSIS — J069 Acute upper respiratory infection, unspecified: Secondary | ICD-10-CM

## 2021-04-24 DIAGNOSIS — J302 Other seasonal allergic rhinitis: Secondary | ICD-10-CM | POA: Diagnosis not present

## 2021-04-24 MED ORDER — MONTELUKAST SODIUM 10 MG PO TABS: 10.0000 mg | ORAL_TABLET | Freq: Every day | ORAL | 11 refills | Status: AC

## 2021-04-24 NOTE — Patient Instructions (Signed)
Order- CXR dx Cough after Covid  We can continue CPAP 10  I have put Singulair on your medication list so we can try it for allergy problems in the Spring to avoid drying antihistamines if nose spray isn't sufficient.

## 2021-04-25 ENCOUNTER — Encounter: Payer: Self-pay | Admitting: *Deleted

## 2021-04-26 DIAGNOSIS — M810 Age-related osteoporosis without current pathological fracture: Secondary | ICD-10-CM | POA: Diagnosis not present

## 2021-04-26 DIAGNOSIS — M4726 Other spondylosis with radiculopathy, lumbar region: Secondary | ICD-10-CM | POA: Diagnosis not present

## 2021-04-26 DIAGNOSIS — I10 Essential (primary) hypertension: Secondary | ICD-10-CM | POA: Diagnosis not present

## 2021-04-26 DIAGNOSIS — E7800 Pure hypercholesterolemia, unspecified: Secondary | ICD-10-CM | POA: Diagnosis not present

## 2021-04-26 DIAGNOSIS — M1990 Unspecified osteoarthritis, unspecified site: Secondary | ICD-10-CM | POA: Diagnosis not present

## 2021-04-26 DIAGNOSIS — K219 Gastro-esophageal reflux disease without esophagitis: Secondary | ICD-10-CM | POA: Diagnosis not present

## 2021-04-26 DIAGNOSIS — M150 Primary generalized (osteo)arthritis: Secondary | ICD-10-CM | POA: Diagnosis not present

## 2021-05-04 DIAGNOSIS — M75102 Unspecified rotator cuff tear or rupture of left shoulder, not specified as traumatic: Secondary | ICD-10-CM | POA: Diagnosis not present

## 2021-05-04 DIAGNOSIS — M12812 Other specific arthropathies, not elsewhere classified, left shoulder: Secondary | ICD-10-CM | POA: Diagnosis not present

## 2021-05-04 DIAGNOSIS — M19012 Primary osteoarthritis, left shoulder: Secondary | ICD-10-CM | POA: Diagnosis not present

## 2021-05-07 NOTE — Assessment & Plan Note (Signed)
Residual cough after Covid Plan- CXR

## 2021-05-07 NOTE — Assessment & Plan Note (Signed)
CPAP 10 now changed to [employer]. Replaced machine. Benefits with good compliance and control. Plam-Continue CPAP 10

## 2021-05-07 NOTE — Assessment & Plan Note (Signed)
Managed with Nasacort

## 2021-05-08 DIAGNOSIS — M75102 Unspecified rotator cuff tear or rupture of left shoulder, not specified as traumatic: Secondary | ICD-10-CM | POA: Diagnosis not present

## 2021-05-08 DIAGNOSIS — Z01818 Encounter for other preprocedural examination: Secondary | ICD-10-CM | POA: Diagnosis not present

## 2021-05-08 DIAGNOSIS — M19012 Primary osteoarthritis, left shoulder: Secondary | ICD-10-CM | POA: Diagnosis not present

## 2021-05-08 DIAGNOSIS — M12812 Other specific arthropathies, not elsewhere classified, left shoulder: Secondary | ICD-10-CM | POA: Diagnosis not present

## 2021-05-11 DIAGNOSIS — Z9109 Other allergy status, other than to drugs and biological substances: Secondary | ICD-10-CM | POA: Diagnosis not present

## 2021-05-11 DIAGNOSIS — M12812 Other specific arthropathies, not elsewhere classified, left shoulder: Secondary | ICD-10-CM | POA: Diagnosis not present

## 2021-05-11 DIAGNOSIS — M75102 Unspecified rotator cuff tear or rupture of left shoulder, not specified as traumatic: Secondary | ICD-10-CM | POA: Diagnosis not present

## 2021-05-15 DIAGNOSIS — Z888 Allergy status to other drugs, medicaments and biological substances status: Secondary | ICD-10-CM | POA: Diagnosis not present

## 2021-05-15 DIAGNOSIS — Z9109 Other allergy status, other than to drugs and biological substances: Secondary | ICD-10-CM | POA: Diagnosis not present

## 2021-05-16 ENCOUNTER — Other Ambulatory Visit: Payer: Self-pay

## 2021-05-16 ENCOUNTER — Ambulatory Visit
Admission: RE | Admit: 2021-05-16 | Discharge: 2021-05-16 | Disposition: A | Discharge status: Home / Self Care | Payer: Medicare Other | Source: Ambulatory Visit | Attending: Family Medicine | Admitting: Family Medicine

## 2021-05-16 DIAGNOSIS — Z1231 Encounter for screening mammogram for malignant neoplasm of breast: Secondary | ICD-10-CM

## 2021-05-24 ENCOUNTER — Encounter: Payer: Self-pay | Admitting: Internal Medicine

## 2021-05-25 DIAGNOSIS — M12812 Other specific arthropathies, not elsewhere classified, left shoulder: Secondary | ICD-10-CM | POA: Diagnosis not present

## 2021-05-25 DIAGNOSIS — M75102 Unspecified rotator cuff tear or rupture of left shoulder, not specified as traumatic: Secondary | ICD-10-CM | POA: Diagnosis not present

## 2021-06-09 DIAGNOSIS — L259 Unspecified contact dermatitis, unspecified cause: Secondary | ICD-10-CM | POA: Diagnosis not present

## 2021-06-12 DIAGNOSIS — M4726 Other spondylosis with radiculopathy, lumbar region: Secondary | ICD-10-CM | POA: Diagnosis not present

## 2021-06-12 DIAGNOSIS — M1990 Unspecified osteoarthritis, unspecified site: Secondary | ICD-10-CM | POA: Diagnosis not present

## 2021-06-12 DIAGNOSIS — K219 Gastro-esophageal reflux disease without esophagitis: Secondary | ICD-10-CM | POA: Diagnosis not present

## 2021-06-12 DIAGNOSIS — E7800 Pure hypercholesterolemia, unspecified: Secondary | ICD-10-CM | POA: Diagnosis not present

## 2021-06-12 DIAGNOSIS — I10 Essential (primary) hypertension: Secondary | ICD-10-CM | POA: Diagnosis not present

## 2021-06-12 DIAGNOSIS — M810 Age-related osteoporosis without current pathological fracture: Secondary | ICD-10-CM | POA: Diagnosis not present

## 2021-06-12 DIAGNOSIS — M150 Primary generalized (osteo)arthritis: Secondary | ICD-10-CM | POA: Diagnosis not present

## 2021-06-28 DIAGNOSIS — K51918 Ulcerative colitis, unspecified with other complication: Secondary | ICD-10-CM | POA: Diagnosis not present

## 2021-06-28 DIAGNOSIS — M19012 Primary osteoarthritis, left shoulder: Secondary | ICD-10-CM | POA: Diagnosis not present

## 2021-07-05 ENCOUNTER — Encounter: Payer: Self-pay | Admitting: Podiatry

## 2021-07-05 ENCOUNTER — Other Ambulatory Visit: Payer: Self-pay

## 2021-07-05 ENCOUNTER — Ambulatory Visit: Payer: Medicare Other | Admitting: Podiatry

## 2021-07-05 DIAGNOSIS — L03032 Cellulitis of left toe: Secondary | ICD-10-CM | POA: Diagnosis not present

## 2021-07-05 NOTE — Progress Notes (Signed)
Subjective:   Patient ID: [name] Finley, female   DOB: 75 y.o.   MRN: [id number]   HPI Patient presents stating she is having shoulder replacement in a week and she is developed a small amount of redness and discomfort around the inside of her left big toe and she is concerned about it prior to the surgery   ROS      Objective:  Physical Exam  Neurovascular status intact with a incurvation of the left hallux lateral border localized with no active drainage mild redness and discomfort     Assessment:  Possibility for low-grade paronychia of the left hallux lateral border     Plan:  H&P reviewed condition and I do think we can do a careful paronychia excision of the lateral border without trauma and she can soak and it should heal uneventfully and prevent her from developing any pathology which could cause problems with her shoulder replacement.  She wants procedure and today infiltrated the left hallux 60 mg like Marcaine mixture sterile prep done and using sterile instrumentation I remove the lateral border I carefully cleaned the lateral fat out with a small amount of necrotic tissue did not note any drainage applied sterile dressing instructed on soaks and will be seen back if any issues were to occur

## 2021-07-11 DIAGNOSIS — I872 Venous insufficiency (chronic) (peripheral): Secondary | ICD-10-CM | POA: Diagnosis not present

## 2021-07-12 DIAGNOSIS — M5136 Other intervertebral disc degeneration, lumbar region: Secondary | ICD-10-CM | POA: Diagnosis not present

## 2021-07-12 DIAGNOSIS — Z471 Aftercare following joint replacement surgery: Secondary | ICD-10-CM | POA: Diagnosis not present

## 2021-07-12 DIAGNOSIS — N3289 Other specified disorders of bladder: Secondary | ICD-10-CM | POA: Diagnosis not present

## 2021-07-12 DIAGNOSIS — Z7984 Long term (current) use of oral hypoglycemic drugs: Secondary | ICD-10-CM | POA: Diagnosis not present

## 2021-07-12 DIAGNOSIS — Z7409 Other reduced mobility: Secondary | ICD-10-CM | POA: Diagnosis not present

## 2021-07-12 DIAGNOSIS — Z9989 Dependence on other enabling machines and devices: Secondary | ICD-10-CM | POA: Diagnosis not present

## 2021-07-12 DIAGNOSIS — M12812 Other specific arthropathies, not elsewhere classified, left shoulder: Secondary | ICD-10-CM | POA: Diagnosis not present

## 2021-07-12 DIAGNOSIS — Z96612 Presence of left artificial shoulder joint: Secondary | ICD-10-CM | POA: Diagnosis not present

## 2021-07-12 DIAGNOSIS — E785 Hyperlipidemia, unspecified: Secondary | ICD-10-CM | POA: Diagnosis not present

## 2021-07-12 DIAGNOSIS — Z8616 Personal history of COVID-19: Secondary | ICD-10-CM | POA: Diagnosis not present

## 2021-07-12 DIAGNOSIS — K219 Gastro-esophageal reflux disease without esophagitis: Secondary | ICD-10-CM | POA: Diagnosis not present

## 2021-07-12 DIAGNOSIS — I10 Essential (primary) hypertension: Secondary | ICD-10-CM | POA: Diagnosis not present

## 2021-07-12 DIAGNOSIS — R0609 Other forms of dyspnea: Secondary | ICD-10-CM | POA: Diagnosis not present

## 2021-07-12 DIAGNOSIS — G8918 Other acute postprocedural pain: Secondary | ICD-10-CM | POA: Diagnosis not present

## 2021-07-12 DIAGNOSIS — M13812 Other specified arthritis, left shoulder: Secondary | ICD-10-CM | POA: Diagnosis not present

## 2021-07-12 DIAGNOSIS — E119 Type 2 diabetes mellitus without complications: Secondary | ICD-10-CM | POA: Diagnosis not present

## 2021-07-12 DIAGNOSIS — S46112A Strain of muscle, fascia and tendon of long head of biceps, left arm, initial encounter: Secondary | ICD-10-CM | POA: Diagnosis not present

## 2021-07-12 DIAGNOSIS — E782 Mixed hyperlipidemia: Secondary | ICD-10-CM | POA: Diagnosis not present

## 2021-07-12 DIAGNOSIS — R5381 Other malaise: Secondary | ICD-10-CM | POA: Diagnosis not present

## 2021-07-12 DIAGNOSIS — M75102 Unspecified rotator cuff tear or rupture of left shoulder, not specified as traumatic: Secondary | ICD-10-CM | POA: Diagnosis not present

## 2021-07-12 DIAGNOSIS — Z79899 Other long term (current) drug therapy: Secondary | ICD-10-CM | POA: Diagnosis not present

## 2021-07-12 DIAGNOSIS — G4733 Obstructive sleep apnea (adult) (pediatric): Secondary | ICD-10-CM | POA: Diagnosis not present

## 2021-07-12 DIAGNOSIS — K6289 Other specified diseases of anus and rectum: Secondary | ICD-10-CM | POA: Diagnosis not present

## 2021-07-12 DIAGNOSIS — M5126 Other intervertebral disc displacement, lumbar region: Secondary | ICD-10-CM | POA: Diagnosis not present

## 2021-07-13 DIAGNOSIS — G4733 Obstructive sleep apnea (adult) (pediatric): Secondary | ICD-10-CM | POA: Diagnosis not present

## 2021-07-13 DIAGNOSIS — M12812 Other specific arthropathies, not elsewhere classified, left shoulder: Secondary | ICD-10-CM | POA: Diagnosis not present

## 2021-07-13 DIAGNOSIS — K6289 Other specified diseases of anus and rectum: Secondary | ICD-10-CM | POA: Diagnosis not present

## 2021-07-13 DIAGNOSIS — M13812 Other specified arthritis, left shoulder: Secondary | ICD-10-CM | POA: Diagnosis not present

## 2021-07-13 DIAGNOSIS — N3289 Other specified disorders of bladder: Secondary | ICD-10-CM | POA: Diagnosis not present

## 2021-07-13 DIAGNOSIS — M5126 Other intervertebral disc displacement, lumbar region: Secondary | ICD-10-CM | POA: Diagnosis not present

## 2021-07-13 DIAGNOSIS — M5136 Other intervertebral disc degeneration, lumbar region: Secondary | ICD-10-CM | POA: Diagnosis not present

## 2021-07-13 DIAGNOSIS — R0609 Other forms of dyspnea: Secondary | ICD-10-CM | POA: Diagnosis not present

## 2021-07-13 DIAGNOSIS — K219 Gastro-esophageal reflux disease without esophagitis: Secondary | ICD-10-CM | POA: Diagnosis not present

## 2021-07-13 DIAGNOSIS — E119 Type 2 diabetes mellitus without complications: Secondary | ICD-10-CM | POA: Diagnosis not present

## 2021-07-13 DIAGNOSIS — Z79899 Other long term (current) drug therapy: Secondary | ICD-10-CM | POA: Diagnosis not present

## 2021-07-13 DIAGNOSIS — Z9989 Dependence on other enabling machines and devices: Secondary | ICD-10-CM | POA: Diagnosis not present

## 2021-07-13 DIAGNOSIS — Z8616 Personal history of COVID-19: Secondary | ICD-10-CM | POA: Diagnosis not present

## 2021-07-13 DIAGNOSIS — Z7984 Long term (current) use of oral hypoglycemic drugs: Secondary | ICD-10-CM | POA: Diagnosis not present

## 2021-07-13 DIAGNOSIS — M75102 Unspecified rotator cuff tear or rupture of left shoulder, not specified as traumatic: Secondary | ICD-10-CM | POA: Diagnosis not present

## 2021-07-13 DIAGNOSIS — Z7409 Other reduced mobility: Secondary | ICD-10-CM | POA: Diagnosis not present

## 2021-07-13 DIAGNOSIS — E782 Mixed hyperlipidemia: Secondary | ICD-10-CM | POA: Diagnosis not present

## 2021-07-13 DIAGNOSIS — R5381 Other malaise: Secondary | ICD-10-CM | POA: Diagnosis not present

## 2021-07-13 DIAGNOSIS — E785 Hyperlipidemia, unspecified: Secondary | ICD-10-CM | POA: Diagnosis not present

## 2021-07-13 DIAGNOSIS — I10 Essential (primary) hypertension: Secondary | ICD-10-CM | POA: Diagnosis not present

## 2021-07-17 DIAGNOSIS — M79604 Pain in right leg: Secondary | ICD-10-CM | POA: Diagnosis not present

## 2021-07-17 DIAGNOSIS — K6289 Other specified diseases of anus and rectum: Secondary | ICD-10-CM | POA: Diagnosis not present

## 2021-07-27 DIAGNOSIS — Z471 Aftercare following joint replacement surgery: Secondary | ICD-10-CM | POA: Diagnosis not present

## 2021-07-27 DIAGNOSIS — Z4789 Encounter for other orthopedic aftercare: Secondary | ICD-10-CM | POA: Diagnosis not present

## 2021-07-27 DIAGNOSIS — Z96612 Presence of left artificial shoulder joint: Secondary | ICD-10-CM | POA: Diagnosis not present

## 2021-07-27 DIAGNOSIS — M75102 Unspecified rotator cuff tear or rupture of left shoulder, not specified as traumatic: Secondary | ICD-10-CM | POA: Diagnosis not present

## 2021-07-27 DIAGNOSIS — M12812 Other specific arthropathies, not elsewhere classified, left shoulder: Secondary | ICD-10-CM | POA: Diagnosis not present

## 2021-08-04 DIAGNOSIS — M150 Primary generalized (osteo)arthritis: Secondary | ICD-10-CM | POA: Diagnosis not present

## 2021-08-04 DIAGNOSIS — M1990 Unspecified osteoarthritis, unspecified site: Secondary | ICD-10-CM | POA: Diagnosis not present

## 2021-08-04 DIAGNOSIS — M810 Age-related osteoporosis without current pathological fracture: Secondary | ICD-10-CM | POA: Diagnosis not present

## 2021-08-04 DIAGNOSIS — K219 Gastro-esophageal reflux disease without esophagitis: Secondary | ICD-10-CM | POA: Diagnosis not present

## 2021-08-04 DIAGNOSIS — M4726 Other spondylosis with radiculopathy, lumbar region: Secondary | ICD-10-CM | POA: Diagnosis not present

## 2021-08-04 DIAGNOSIS — I10 Essential (primary) hypertension: Secondary | ICD-10-CM | POA: Diagnosis not present

## 2021-08-04 DIAGNOSIS — E7800 Pure hypercholesterolemia, unspecified: Secondary | ICD-10-CM | POA: Diagnosis not present

## 2021-08-17 DIAGNOSIS — G4733 Obstructive sleep apnea (adult) (pediatric): Secondary | ICD-10-CM | POA: Diagnosis not present

## 2021-08-24 ENCOUNTER — Telehealth: Payer: Self-pay | Admitting: Internal Medicine

## 2021-08-24 MED ORDER — AZITHROMYCIN 250 MG PO TABS: ORAL_TABLET | ORAL | 0 refills | Status: DC

## 2021-08-24 NOTE — Telephone Encounter (Signed)
Patient here with husband. Acute bronchitis. Plan- Zpak sent for her.

## 2021-08-30 DIAGNOSIS — M1711 Unilateral primary osteoarthritis, right knee: Secondary | ICD-10-CM | POA: Diagnosis not present

## 2021-08-30 DIAGNOSIS — M25561 Pain in right knee: Secondary | ICD-10-CM | POA: Diagnosis not present

## 2021-08-31 DIAGNOSIS — Z96612 Presence of left artificial shoulder joint: Secondary | ICD-10-CM | POA: Diagnosis not present

## 2021-08-31 DIAGNOSIS — M25511 Pain in right shoulder: Secondary | ICD-10-CM | POA: Diagnosis not present

## 2021-08-31 DIAGNOSIS — Z9889 Other specified postprocedural states: Secondary | ICD-10-CM | POA: Diagnosis not present

## 2021-09-01 ENCOUNTER — Telehealth: Payer: Self-pay | Admitting: Internal Medicine

## 2021-09-01 NOTE — Telephone Encounter (Signed)
Called patient but she did not answer. Left message for her to call back.  

## 2021-10-09 ENCOUNTER — Telehealth: Payer: Self-pay | Admitting: Internal Medicine

## 2021-10-09 NOTE — Telephone Encounter (Signed)
Fax received from Dr. Tonita Cong with [facility] to perform a Right Knee Arthroplasty on patient.  Patient needs surgery clearance. Patient was seen on 04/24/2021 and is scheduled for OV on 10/18/2021 with Katie . Office protocol is a risk assessment can be sent to surgeon if patient has been seen in 60 days or less.   Sending to [facility] for risk assessment or recommendations if patient needs to be seen in office prior to surgical procedure.

## 2021-10-18 ENCOUNTER — Other Ambulatory Visit: Payer: Self-pay

## 2021-10-18 ENCOUNTER — Ambulatory Visit: Payer: Self-pay | Admitting: Nurse Practitioner

## 2021-10-18 ENCOUNTER — Ambulatory Visit (INDEPENDENT_AMBULATORY_CARE_PROVIDER_SITE_OTHER): Payer: Self-pay

## 2021-10-18 ENCOUNTER — Encounter: Payer: Self-pay | Admitting: Nurse Practitioner

## 2021-10-18 VITALS — BP 128/76 | HR 60 | Temp 97.3°F | Ht 61.0 in | Wt 216.0 lb

## 2021-10-18 DIAGNOSIS — G4733 Obstructive sleep apnea (adult) (pediatric): Secondary | ICD-10-CM

## 2021-10-18 DIAGNOSIS — R0681 Apnea, not elsewhere classified: Secondary | ICD-10-CM | POA: Diagnosis not present

## 2021-10-18 DIAGNOSIS — Z01818 Encounter for other preprocedural examination: Secondary | ICD-10-CM | POA: Diagnosis not present

## 2021-10-18 DIAGNOSIS — Z01811 Encounter for preprocedural respiratory examination: Secondary | ICD-10-CM

## 2021-10-18 NOTE — Assessment & Plan Note (Signed)
Compliant with therapy. AHI and leaks stable compared to previous downloads with mild breakthrough OSA. Receives benefit from usage. Advised to take machine and mask with her to surgery.   Patient Instructions  Continue Allegra 1/2-1 tab daily for allergies -Continue Singulair 10 mg At bedtime  -Continue Nasocort 1 puff each nostril as needed for allergies/nasal drainage   Continue to use CPAP every night, minimum of 4-6 hours a night.  Change equipment every 30 days or as directed by DME. Wash your tubing with warm soap and water daily, hang to dry. Wash humidifier portion weekly.  Maintain clean equipment, as directed by home health agency.  Be aware of reduced alertness and do not drive or operate heavy machinery if experiencing this or drowsiness.  Exercise encouraged, as tolerated. Healthy weight management discussed.  Avoid or decrease alcohol consumption and medications that make you more sleepy, if possible. Notify if persistent daytime sleepiness occurs even with consistent use of CPAP.  Follow up in August with Dr. Annamaria Boots as scheduled. If symptoms do not improve or worsen, please contact office for sooner follow up or seek emergency care.

## 2021-10-18 NOTE — Assessment & Plan Note (Addendum)
Compliant with CPAP and well-controlled OSA. CXR today for clearance. Low moderate risk given well-controlled OSA.  Factors that increase the risk for postoperative pulmonary complications are obstructive sleep apnea, obesity and age >24  Respiratory complications generally occur in 1% of ASA Class I patients, 5% of ASA Class II and 10% of ASA Class III-IV patients These complications rarely result in mortality and include postoperative pneumonia, atelectasis, pulmonary embolism, ARDS and increased time requiring postoperative mechanical ventilation.   Overall, I recommend proceeding with the surgery if the risk for respiratory complications are outweighed by the potential benefits. This will need to be discussed between the patient and surgeon.   To reduce risks of respiratory complications, I recommend: --Pre- and post-operative incentive spirometry performed frequently while awake --Inpatient use of currently prescribed positive-pressure for OSA whenever the patient is sleeping --Short duration of surgery as much as possible and avoid paralytic if possible --OOB, encourage mobility post-op   1) RISK FOR PROLONGED MECHANICAL VENTILAION - > 48h  1A) Arozullah - Prolonged mech ventilation risk Arozullah Postperative Pulmonary Risk Score - for mech ventilation dependence >48h [employer], Ann Surg 2000, major non-cardiac surgery) Comment Score  Type of surgery - abd ao aneurysm (27), thoracic (21), neurosurgery / upper abdominal / vascular (21), neck (11) knee 0  Emergency Surgery - (11)  0  ALbumin < 3 or poor nutritional state - (9)  0  BUN > 30 -  (8)  0  Partial or completely dependent functional status - (7)  0  COPD -  (6)  0  Age - 60 to 69 (4), > 70  (6) 75 6  TOTAL  6  Risk Stratifcation scores  - < 10 (0.5%), 11-19 (1.8%), 20-27 (4.2%), 28-40 (10.1%), >40 (26.6%)  0.5%

## 2021-10-18 NOTE — Progress Notes (Signed)
_0  ID: [name] Finley, female    DOB: 12-Jun-1946, 76 y.o.   MRN: [id number]  Chief Complaint  Patient presents with   Follow-up    She is having a right knee replacement at [facility].     Referring provider: Kathyrn Lass, MD  HPI: 76 year old female, never smoker followed for OSA on CPAP. She is a patient of Dr. Janee Morn and last seen in office on 04/24/2021. Past medical history significant for seasonal allergic rhinitis and HTN.   TEST/EVENTS:  04/24/2021 CXR 2 View: lungs well aerated b/l without any acute process.  04/24/2021: OV with Dr. Annamaria Boots. Tx for acute flare in allergies with cough. CXR clear. Compliant with CPAP 10 cmH2O therapy, AHI 4.5/hr with moderate leaks.   10/18/2021: Today - surgical clearance Patient presents today for surgical clearance before right total knee replacement. She has been compliant with CPAP therapy with set pressure of 10 cmH2O. She denies any excessive daytime fatigue, drowsy driving or morning headaches. She reports her breathing is stable and overall, she feels well and is ready for her surgery. She will be staying overnight afterwards and hopes to be discharged to rehab for further assistance. She uses Singulair nightly and Allegra and nasocort as needed for allergies.   [employer] Download: CPAP 10 cmH2O 30/30 days (100% >4 hr), av usage 6 hours and 31 min Median leaks 14.8 L/min AHI 6.2/hr   Allergies  Allergen Reactions   Other Itching and Other (See Comments)    Perfumes cause irritation Slight irritation- unknown metal   Pollen Extract Other (See Comments)    Headache and Nasal Congestion. Allergy symptoms   Tape Rash    Other reaction(s): Other (See Comments) Causes redness    Immunization History  Administered Date(s) Administered   Influenza Split 07/26/2009, 04/29/2012, 05/03/2014, 07/04/2020   Influenza Whole 04/27/2013   Influenza, High Dose Seasonal PF 07/11/2018   Influenza,inj,Quad PF,6+ Mos  05/15/2011, 04/27/2016   Influenza,inj,quad, With Preservative 05/06/2015   Influenza-Unspecified 06/28/2015, 04/27/2016, 05/26/2017, 05/15/2018, 05/21/2019   PFIZER(Purple Top)SARS-COV-2 Vaccination 09/21/2019, 10/12/2019, 06/14/2020   Pneumococcal Conjugate-13 10/29/2013, 08/27/2014   Pneumococcal Polysaccharide-23 11/27/2006, 08/27/2010, 10/29/2012   Tdap 11/27/2006, 01/26/2019   Zoster, Live 01/01/2008, 02/07/2018, 04/30/2018    Past Medical History:  Diagnosis Date   Allergic rhinitis    Asthma    Hypertension    Sleep apnea     Tobacco History: Social History   Tobacco Use  Smoking Status Never  Smokeless Tobacco Never   Counseling given: Not Answered   Outpatient Medications Prior to Visit  Medication Sig Dispense Refill   acetaminophen (TYLENOL) 500 MG tablet Take 500 mg by mouth as needed.     Ascorbic Acid (VITAMIN C) 1000 MG tablet Take 1,000 mg by mouth every evening.     atorvastatin (LIPITOR) 10 MG tablet Take 10 mg by mouth every evening.      B Complex-C (B-COMPLEX WITH VITAMIN C) tablet Take 1 tablet by mouth daily.     Calcium & Magnesium Carbonates (MYLANTA PO) Take 30 mLs by mouth daily as needed (constipation).      calcium citrate-vitamin D (CITRACAL+D) 315-200 MG-UNIT tablet Take 1 tablet by mouth 2 (two) times daily.     Cholecalciferol (VITAMIN D) 2000 units CAPS Take 2,000 Units by mouth 2 (two) times daily.      Famotidine (PEPCID PO) Take 1 tablet by mouth daily as needed (heartburn).      fexofenadine (ALLEGRA) 180 MG tablet Take 90  mg by mouth as needed.     glucosamine-chondroitin 500-400 MG tablet Take 1 tablet by mouth daily.     hydrochlorothiazide (MICROZIDE) 12.5 MG capsule Take 12.5 mg by mouth every morning.     losartan (COZAAR) 100 MG tablet Take 100 mg by mouth daily.     losartan-hydrochlorothiazide (HYZAAR) 100-12.5 MG per tablet Take 1 tablet by mouth daily.      Melatonin 5 MG TABS Take 2 tablets by mouth at bedtime.      mesalamine (APRISO) 0.375 g 24 hr capsule Take 1.5 g by mouth every morning.     metFORMIN (GLUCOPHAGE) 500 MG tablet Take 500 mg by mouth 2 (two) times daily with a meal.      mirabegron ER (MYRBETRIQ) 50 MG TB24 tablet 1 tablet     montelukast (SINGULAIR) 10 MG tablet Take 1 tablet (10 mg total) by mouth at bedtime. (Patient taking differently: Take 10 mg by mouth as needed.) 30 tablet 11   Multiple Vitamins-Minerals (MULTIVITAMIN WITH MINERALS) tablet Take 1 tablet by mouth daily.     pantoprazole (PROTONIX) 40 MG tablet Take by mouth.     Polyethylene Glycol 400 (BLINK TEARS) 0.25 % SOLN Place 1 drop into both eyes 3 (three)  times daily as needed for Dry Eyes.     Pseudoephedrine-Guaifenesin (MUCINEX D MAX STRENGTH) [phone] MG TB12 Take 1,200 mg by mouth as needed.     traMADol (ULTRAM) 50 MG tablet Take 1 tablet (50 mg total) by mouth every 6 (six) hours as needed. 40 tablet 0   triamcinolone (NASACORT) 55 MCG/ACT AERO nasal inhaler 1 puff in each nostril     Triamcinolone Acetonide (NASACORT ALLERGY 24HR NA) Place 2 sprays into the nose at bedtime as needed (allergies).      TURMERIC PO Take 1 capsule by mouth daily.     zolpidem (AMBIEN) 5 MG tablet Take 5 mg by mouth at bedtime as needed for sleep.     acetaminophen (TYLENOL) 650 MG CR tablet Take 650 mg by mouth every 8 (eight) hours as needed for pain. 650 BID (Patient not taking: Reported on 10/18/2021)     azithromycin (ZITHROMAX) 250 MG tablet 2 today then one daily (Patient not taking: Reported on 10/18/2021) 6 tablet 0   Cholecalciferol 50 MCG (2000 UT) CAPS Take 1 capsule by mouth 2 (two) times daily. (Patient not taking: Reported on 10/18/2021)     guaiFENesin (MUCINEX) 600 MG 12 hr tablet Take by mouth. (Patient not taking: Reported on 10/18/2021)     omeprazole (PRILOSEC) 40 MG capsule Take 40 mg by mouth every morning. (Patient not taking: Reported on 10/18/2021)     No facility-administered medications prior to visit.      Review of Systems:   Constitutional: No weight loss or gain, night sweats, fevers, chills, fatigue, or lassitude. HEENT: No headaches, difficulty swallowing, tooth/dental problems, or sore throat. No sneezing, itching, ear ache, nasal congestion, or post nasal drip CV:  No chest pain, orthopnea, PND, swelling in lower extremities, anasarca, dizziness, palpitations, syncope Resp: No shortness of breath with exertion or at rest. No excess mucus or change in color of mucus. No productive or non-productive. No hemoptysis. No wheezing.  No chest wall deformity GI:  No heartburn, indigestion, abdominal pain, nausea, vomiting, diarrhea, change in bowel habits, loss of appetite, bloody stools.  GU: No dysuria, change in color of urine, urgency or frequency.  No flank pain, no hematuria  Skin: No rash, lesions, ulcerations MSK:  No joint pain or swelling.  No decreased range of motion.  No back pain. Neuro: No dizziness or lightheadedness.  Psych: No depression or anxiety. Mood stable.     Physical Exam:  BP 128/76 (BP Location: Left Arm, Patient Position: Sitting, Cuff Size: Normal)    Pulse 60    Temp (!) 97.3 F (36.3 C) (Oral)    Ht _0  (1.549 m)    Wt 216 lb (98 kg)    SpO2 97%    BMI 40.81 kg/m   GEN: Pleasant, interactive, well-appearing; morbidly obese; in no acute distress. HEENT:  Normocephalic and atraumatic. EACs patent bilaterally. TM pearly gray with present light reflex bilaterally. PERRLA. Sclera white. Nasal turbinates pink, moist and patent bilaterally. No rhinorrhea present. Oropharynx pink and moist, without exudate or edema. No lesions, ulcerations, or postnasal drip.  NECK:  Supple w/ fair ROM. No JVD present. Normal carotid impulses w/o bruits. Thyroid symmetrical with no goiter or nodules palpated. No lymphadenopathy.   CV: RRR, no m/r/g, no peripheral edema. Pulses intact, +2 bilaterally. No cyanosis, pallor or clubbing. PULMONARY:  Unlabored, regular breathing.  Clear bilaterally A&P w/o wheezes/rales/rhonchi. No accessory muscle use. No dullness to percussion. GI: BS present and normoactive. Soft, non-tender to palpation. No organomegaly or masses detected. No CVA tenderness. MSK: No erythema, warmth or tenderness. Cap refil <2 sec all extrem. No deformities or joint swelling noted.  Neuro: A/Ox3. No focal deficits noted.   Skin: Warm, no lesions or rashe Psych: Normal affect and behavior. Judgement and thought content appropriate.     Lab Results:  CBC    Component Value Date/Time   WBC 9.1 03/01/2021 1550   RBC 3.83 03/01/2021 1550   HGB 12.1 03/01/2021 1550   HCT 36.0 03/01/2021 1550   PLT 292 03/01/2021 1550   MCV 94.0 03/01/2021 1550   MCH 31.6 03/01/2021 1550   MCHC 33.6 03/01/2021 1550   RDW 13.1 03/01/2021 1550   LYMPHSABS 2,421 03/01/2021 1550   EOSABS 273 03/01/2021 1550   BASOSABS 64 03/01/2021 1550    BMET    Component Value Date/Time   NA 141 03/12/2007 0913   K 3.9 03/12/2007 0913   CL 106 03/12/2007 0913   CO2 28 03/12/2007 0913   GLUCOSE 102 (H) 03/12/2007 0913   BUN 13 03/12/2007 0913   CREATININE 0.68 03/12/2007 0913   CALCIUM 9.5 03/12/2007 0913   GFRNONAA >60 03/12/2007 0913   GFRAA  03/12/2007 0913    >60        The eGFR has been calculated using the MDRD equation. This calculation has not been validated in all clinical    BNP No results found for: BNP   Imaging:  No results found.    No flowsheet data found.  No results found for: NITRICOXIDE      Assessment & Plan:   Obstructive sleep apnea Compliant with therapy. AHI and leaks stable compared to previous downloads with mild breakthrough OSA. Receives benefit from usage. Advised to take machine and mask with her to surgery.   Patient Instructions  Continue Allegra 1/2-1 tab daily for allergies -Continue Singulair 10 mg At bedtime  -Continue Nasocort 1 puff each nostril as needed for allergies/nasal drainage   Continue to use  CPAP every night, minimum of 4-6 hours a night.  Change equipment every 30 days or as directed by DME. Wash your tubing with warm soap and water daily, hang to dry. Wash humidifier portion weekly.  Maintain clean equipment, as  directed by home health agency.  Be aware of reduced alertness and do not drive or operate heavy machinery if experiencing this or drowsiness.  Exercise encouraged, as tolerated. Healthy weight management discussed.  Avoid or decrease alcohol consumption and medications that make you more sleepy, if possible. Notify if persistent daytime sleepiness occurs even with consistent use of CPAP.  Follow up in August with Dr. Annamaria Boots as scheduled. If symptoms do not improve or worsen, please contact office for sooner follow up or seek emergency care.    Preoperative respiratory examination Compliant with CPAP and well-controlled OSA. CXR today for clearance. Low moderate risk given well-controlled OSA.  Factors that increase the risk for postoperative pulmonary complications are obstructive sleep apnea, obesity and age >72  Respiratory complications generally occur in 1% of ASA Class I patients, 5% of ASA Class II and 10% of ASA Class III-IV patients These complications rarely result in mortality and include postoperative pneumonia, atelectasis, pulmonary embolism, ARDS and increased time requiring postoperative mechanical ventilation.   Overall, I recommend proceeding with the surgery if the risk for respiratory complications are outweighed by the potential benefits. This will need to be discussed between the patient and surgeon.   To reduce risks of respiratory complications, I recommend: --Pre- and post-operative incentive spirometry performed frequently while awake --Inpatient use of currently prescribed positive-pressure for OSA whenever the patient is sleeping --Short duration of surgery as much as possible and avoid paralytic if possible --OOB, encourage mobility  post-op   1) RISK FOR PROLONGED MECHANICAL VENTILAION - > 48h  1A) Arozullah - Prolonged mech ventilation risk Arozullah Postperative Pulmonary Risk Score - for mech ventilation dependence >48h [employer], Ann Surg 2000, major non-cardiac surgery) Comment Score  Type of surgery - abd ao aneurysm (27), thoracic (21), neurosurgery / upper abdominal / vascular (21), neck (11) knee 0  Emergency Surgery - (11)  0  ALbumin < 3 or poor nutritional state - (9)  0  BUN > 30 -  (8)  0  Partial or completely dependent functional status - (7)  0  COPD -  (6)  0  Age - 60 to 69 (4), > 70  (6) 76 6  TOTAL  6  Risk Stratifcation scores  - < 10 (0.5%), 11-19 (1.8%), 20-27 (4.2%), 28-40 (10.1%), >40 (26.6%)  0.5%        Clayton Bibles, NP 10/18/2021  Pt aware and understands NP's role.

## 2021-10-18 NOTE — Patient Instructions (Addendum)
Continue Allegra 1/2-1 tab daily for allergies -Continue Singulair 10 mg At bedtime  -Continue Nasocort 1 puff each nostril as needed for allergies/nasal drainage   Continue to use CPAP every night, minimum of 4-6 hours a night.  Change equipment every 30 days or as directed by DME. Wash your tubing with warm soap and water daily, hang to dry. Wash humidifier portion weekly.  Maintain clean equipment, as directed by home health agency.  Be aware of reduced alertness and do not drive or operate heavy machinery if experiencing this or drowsiness.  Exercise encouraged, as tolerated. Healthy weight management discussed.  Avoid or decrease alcohol consumption and medications that make you more sleepy, if possible. Notify if persistent daytime sleepiness occurs even with consistent use of CPAP.  Follow up in August with Dr. Annamaria Boots as scheduled. If symptoms do not improve or worsen, please contact office for sooner follow up or seek emergency care.

## 2021-10-19 NOTE — Telephone Encounter (Signed)
OV notes and clearance form have been faxed over to [facility]. Nothing further needed at this time.

## 2021-10-19 NOTE — Telephone Encounter (Signed)
Pt seen on 2/22 by Roxan Diesel, NP. Will close encounter.

## 2021-10-23 NOTE — Progress Notes (Signed)
CXR with clear lungs and no evidence of active cardiopulmonary disease.

## 2021-10-25 DIAGNOSIS — Z01810 Encounter for preprocedural cardiovascular examination: Secondary | ICD-10-CM | POA: Diagnosis not present

## 2021-10-25 DIAGNOSIS — M810 Age-related osteoporosis without current pathological fracture: Secondary | ICD-10-CM | POA: Diagnosis not present

## 2021-10-25 DIAGNOSIS — G479 Sleep disorder, unspecified: Secondary | ICD-10-CM | POA: Diagnosis not present

## 2021-10-25 DIAGNOSIS — M1711 Unilateral primary osteoarthritis, right knee: Secondary | ICD-10-CM | POA: Diagnosis not present

## 2021-10-25 DIAGNOSIS — R7303 Prediabetes: Secondary | ICD-10-CM | POA: Diagnosis not present

## 2021-10-25 DIAGNOSIS — K146 Glossodynia: Secondary | ICD-10-CM | POA: Diagnosis not present

## 2021-10-25 DIAGNOSIS — I10 Essential (primary) hypertension: Secondary | ICD-10-CM | POA: Diagnosis not present

## 2021-10-30 DIAGNOSIS — Z96612 Presence of left artificial shoulder joint: Secondary | ICD-10-CM | POA: Diagnosis not present

## 2021-10-30 DIAGNOSIS — Z471 Aftercare following joint replacement surgery: Secondary | ICD-10-CM | POA: Diagnosis not present

## 2021-10-30 DIAGNOSIS — M75102 Unspecified rotator cuff tear or rupture of left shoulder, not specified as traumatic: Secondary | ICD-10-CM | POA: Diagnosis not present

## 2021-10-30 DIAGNOSIS — M19012 Primary osteoarthritis, left shoulder: Secondary | ICD-10-CM | POA: Diagnosis not present

## 2021-10-30 DIAGNOSIS — M12812 Other specific arthropathies, not elsewhere classified, left shoulder: Secondary | ICD-10-CM | POA: Diagnosis not present

## 2021-11-07 DIAGNOSIS — I10 Essential (primary) hypertension: Secondary | ICD-10-CM | POA: Diagnosis not present

## 2021-11-07 DIAGNOSIS — R790 Abnormal level of blood mineral: Secondary | ICD-10-CM | POA: Diagnosis not present

## 2021-12-25 DIAGNOSIS — Z01812 Encounter for preprocedural laboratory examination: Secondary | ICD-10-CM | POA: Diagnosis not present

## 2021-12-25 DIAGNOSIS — R9431 Abnormal electrocardiogram [ECG] [EKG]: Secondary | ICD-10-CM | POA: Diagnosis not present

## 2021-12-25 DIAGNOSIS — M1711 Unilateral primary osteoarthritis, right knee: Secondary | ICD-10-CM | POA: Diagnosis not present

## 2021-12-25 DIAGNOSIS — E1165 Type 2 diabetes mellitus with hyperglycemia: Secondary | ICD-10-CM | POA: Diagnosis not present

## 2021-12-25 DIAGNOSIS — Z01810 Encounter for preprocedural cardiovascular examination: Secondary | ICD-10-CM | POA: Diagnosis not present

## 2021-12-26 DIAGNOSIS — Z961 Presence of intraocular lens: Secondary | ICD-10-CM | POA: Diagnosis not present

## 2021-12-26 DIAGNOSIS — H5203 Hypermetropia, bilateral: Secondary | ICD-10-CM | POA: Diagnosis not present

## 2021-12-26 DIAGNOSIS — H02834 Dermatochalasis of left upper eyelid: Secondary | ICD-10-CM | POA: Diagnosis not present

## 2021-12-26 DIAGNOSIS — H524 Presbyopia: Secondary | ICD-10-CM | POA: Diagnosis not present

## 2021-12-26 DIAGNOSIS — H26491 Other secondary cataract, right eye: Secondary | ICD-10-CM | POA: Diagnosis not present

## 2021-12-26 DIAGNOSIS — H43813 Vitreous degeneration, bilateral: Secondary | ICD-10-CM | POA: Diagnosis not present

## 2021-12-26 DIAGNOSIS — E119 Type 2 diabetes mellitus without complications: Secondary | ICD-10-CM | POA: Diagnosis not present

## 2021-12-26 DIAGNOSIS — H02831 Dermatochalasis of right upper eyelid: Secondary | ICD-10-CM | POA: Diagnosis not present

## 2021-12-26 DIAGNOSIS — Z7984 Long term (current) use of oral hypoglycemic drugs: Secondary | ICD-10-CM | POA: Diagnosis not present

## 2021-12-26 DIAGNOSIS — H52223 Regular astigmatism, bilateral: Secondary | ICD-10-CM | POA: Diagnosis not present

## 2021-12-26 DIAGNOSIS — H16223 Keratoconjunctivitis sicca, not specified as Sjogren's, bilateral: Secondary | ICD-10-CM | POA: Diagnosis not present

## 2022-01-01 DIAGNOSIS — Z01810 Encounter for preprocedural cardiovascular examination: Secondary | ICD-10-CM | POA: Diagnosis not present

## 2022-01-01 DIAGNOSIS — I10 Essential (primary) hypertension: Secondary | ICD-10-CM | POA: Diagnosis not present

## 2022-01-01 DIAGNOSIS — G4733 Obstructive sleep apnea (adult) (pediatric): Secondary | ICD-10-CM | POA: Diagnosis not present

## 2022-01-01 DIAGNOSIS — E782 Mixed hyperlipidemia: Secondary | ICD-10-CM | POA: Diagnosis not present

## 2022-01-01 DIAGNOSIS — R9431 Abnormal electrocardiogram [ECG] [EKG]: Secondary | ICD-10-CM | POA: Diagnosis not present

## 2022-01-02 DIAGNOSIS — R9431 Abnormal electrocardiogram [ECG] [EKG]: Secondary | ICD-10-CM | POA: Diagnosis not present

## 2022-01-02 DIAGNOSIS — Z01810 Encounter for preprocedural cardiovascular examination: Secondary | ICD-10-CM | POA: Diagnosis not present

## 2022-01-03 DIAGNOSIS — M4316 Spondylolisthesis, lumbar region: Secondary | ICD-10-CM | POA: Diagnosis not present

## 2022-01-03 DIAGNOSIS — G8929 Other chronic pain: Secondary | ICD-10-CM | POA: Diagnosis not present

## 2022-01-03 DIAGNOSIS — M4727 Other spondylosis with radiculopathy, lumbosacral region: Secondary | ICD-10-CM | POA: Diagnosis not present

## 2022-01-03 DIAGNOSIS — M545 Low back pain, unspecified: Secondary | ICD-10-CM | POA: Diagnosis not present

## 2022-01-18 DIAGNOSIS — M545 Low back pain, unspecified: Secondary | ICD-10-CM | POA: Diagnosis not present

## 2022-01-18 DIAGNOSIS — M5431 Sciatica, right side: Secondary | ICD-10-CM | POA: Diagnosis not present

## 2022-01-18 DIAGNOSIS — M4726 Other spondylosis with radiculopathy, lumbar region: Secondary | ICD-10-CM | POA: Diagnosis not present

## 2022-01-18 DIAGNOSIS — M4316 Spondylolisthesis, lumbar region: Secondary | ICD-10-CM | POA: Diagnosis not present

## 2022-01-29 DIAGNOSIS — M19011 Primary osteoarthritis, right shoulder: Secondary | ICD-10-CM | POA: Diagnosis not present

## 2022-01-29 DIAGNOSIS — M12812 Other specific arthropathies, not elsewhere classified, left shoulder: Secondary | ICD-10-CM | POA: Diagnosis not present

## 2022-01-29 DIAGNOSIS — Z471 Aftercare following joint replacement surgery: Secondary | ICD-10-CM | POA: Diagnosis not present

## 2022-01-29 DIAGNOSIS — Z96612 Presence of left artificial shoulder joint: Secondary | ICD-10-CM | POA: Diagnosis not present

## 2022-01-29 DIAGNOSIS — M75102 Unspecified rotator cuff tear or rupture of left shoulder, not specified as traumatic: Secondary | ICD-10-CM | POA: Diagnosis not present

## 2022-01-29 DIAGNOSIS — Z9889 Other specified postprocedural states: Secondary | ICD-10-CM | POA: Diagnosis not present

## 2022-02-12 DIAGNOSIS — Z01810 Encounter for preprocedural cardiovascular examination: Secondary | ICD-10-CM | POA: Diagnosis not present

## 2022-02-12 DIAGNOSIS — R9431 Abnormal electrocardiogram [ECG] [EKG]: Secondary | ICD-10-CM | POA: Diagnosis not present

## 2022-02-13 DIAGNOSIS — N3946 Mixed incontinence: Secondary | ICD-10-CM | POA: Diagnosis not present

## 2022-02-14 DIAGNOSIS — M5440 Lumbago with sciatica, unspecified side: Secondary | ICD-10-CM | POA: Diagnosis not present

## 2022-02-14 DIAGNOSIS — M5126 Other intervertebral disc displacement, lumbar region: Secondary | ICD-10-CM | POA: Diagnosis not present

## 2022-02-14 DIAGNOSIS — M47816 Spondylosis without myelopathy or radiculopathy, lumbar region: Secondary | ICD-10-CM | POA: Diagnosis not present

## 2022-02-14 DIAGNOSIS — G8929 Other chronic pain: Secondary | ICD-10-CM | POA: Diagnosis not present

## 2022-02-20 DIAGNOSIS — M4316 Spondylolisthesis, lumbar region: Secondary | ICD-10-CM | POA: Diagnosis not present

## 2022-02-20 DIAGNOSIS — M5431 Sciatica, right side: Secondary | ICD-10-CM | POA: Diagnosis not present

## 2022-02-22 DIAGNOSIS — M4316 Spondylolisthesis, lumbar region: Secondary | ICD-10-CM | POA: Diagnosis not present

## 2022-02-22 DIAGNOSIS — M47816 Spondylosis without myelopathy or radiculopathy, lumbar region: Secondary | ICD-10-CM | POA: Diagnosis not present

## 2022-03-02 DIAGNOSIS — K5130 Ulcerative (chronic) rectosigmoiditis without complications: Secondary | ICD-10-CM | POA: Diagnosis not present

## 2022-03-02 DIAGNOSIS — K5900 Constipation, unspecified: Secondary | ICD-10-CM | POA: Diagnosis not present

## 2022-03-02 DIAGNOSIS — K649 Unspecified hemorrhoids: Secondary | ICD-10-CM | POA: Diagnosis not present

## 2022-03-15 DIAGNOSIS — Z01810 Encounter for preprocedural cardiovascular examination: Secondary | ICD-10-CM | POA: Diagnosis not present

## 2022-03-15 DIAGNOSIS — R9431 Abnormal electrocardiogram [ECG] [EKG]: Secondary | ICD-10-CM | POA: Diagnosis not present

## 2022-03-21 DIAGNOSIS — M5116 Intervertebral disc disorders with radiculopathy, lumbar region: Secondary | ICD-10-CM | POA: Diagnosis not present

## 2022-04-03 DIAGNOSIS — M5116 Intervertebral disc disorders with radiculopathy, lumbar region: Secondary | ICD-10-CM | POA: Diagnosis not present

## 2022-04-11 DIAGNOSIS — M810 Age-related osteoporosis without current pathological fracture: Secondary | ICD-10-CM | POA: Diagnosis not present

## 2022-04-11 DIAGNOSIS — Z0000 Encounter for general adult medical examination without abnormal findings: Secondary | ICD-10-CM | POA: Diagnosis not present

## 2022-04-11 DIAGNOSIS — Z1389 Encounter for screening for other disorder: Secondary | ICD-10-CM | POA: Diagnosis not present

## 2022-04-11 DIAGNOSIS — E7800 Pure hypercholesterolemia, unspecified: Secondary | ICD-10-CM | POA: Diagnosis not present

## 2022-04-11 DIAGNOSIS — R7303 Prediabetes: Secondary | ICD-10-CM | POA: Diagnosis not present

## 2022-04-20 ENCOUNTER — Other Ambulatory Visit (HOSPITAL_COMMUNITY): Payer: Self-pay

## 2022-04-20 NOTE — Progress Notes (Signed)
Subjective:    Patient ID: [name] Finley, female    DOB: 29-Mar-1946, 76 y.o.   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 Was treated with MAB IV.  Replaced CPAP and changed to [employer] Nasal congestion treated with Nasacort.  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 Had been cleared by cardiology. Ok with CPAP. Download reviewed. Breathing ok. Uses Nasacort at bedtime andd occasional, cautious use of Afrin.  ROS-see HPI   + = positive Constitutional:    weight loss, night sweats, fevers, chills, fatigue, lassitude. HEENT:    headaches, difficulty swallowing, tooth/dental problems, sore throat,       sneezing, itching, ear ache, + nasal congestion, post nasal drip, snoring CV:    chest pain, orthopnea, PND, swelling in lower extremities, anasarca,                                               dizziness, palpitations Resp:   shortness of breath with exertion or at rest.                 productive cough,   non-productive cough, coughing up of blood.               change in color of mucus.  wheezing.   Skin:    rash or lesions. GI:   No-   heartburn, indigestion, abdominal pain, nausea, vomiting, diarrhea,                 change in bowel habits, loss of appetite GU: dysuria, change in color of urine, no urgency or frequency.   flank pain. MS:   joint pain, stiffness, decreased range of motion, back pain. Neuro-     nothing unusual Psych:  change in mood or affect.  depression or anxiety.   memory loss.  .    Objective:  OBJ- Physical Exam General- Alert, Oriented, Affect-appropriate, Distress- none acute, + obese Skin- rash-none, lesions- none, excoriation- none Lymphadenopathy- none Head- atraumatic            Eyes- Gross vision intact, PERRLA, conjunctivae and secretions clear            Ears- Hearing, canals-normal            Nose- Clear, no-Septal dev, mucus, polyps, erosion, perforation            Throat- Mallampati II , mucosa clear , drainage- none, tonsils- atrophic Neck- flexible , trachea midline, no stridor , thyroid nl, carotid no bruit Chest - symmetrical excursion , unlabored           Heart/CV- RRR ,  no murmur , no gallop  , no rub, nl s1 s2                           - JVD- none , edema- none, stasis changes- none, varices- none           Lung- clear to P&A, wheeze- none, cough- none , dullness-none, rub- none           Chest wall-  Abd-  Br/ Gen/ Rectal- Not done, not indicated Extrem- cyanosis- none, clubbing, none, atrophy- none, strength- nl Neuro- grossly intact to observation    Assessment & Plan:

## 2022-04-23 ENCOUNTER — Ambulatory Visit (INDEPENDENT_AMBULATORY_CARE_PROVIDER_SITE_OTHER): Payer: PPO | Admitting: Internal Medicine

## 2022-04-23 ENCOUNTER — Encounter: Payer: Self-pay | Admitting: Internal Medicine

## 2022-04-23 ENCOUNTER — Encounter (HOSPITAL_COMMUNITY)
Admission: RE | Admit: 2022-04-23 | Discharge: 2022-04-23 | Disposition: A | Discharge status: Home / Self Care | Payer: PPO | Source: Ambulatory Visit | Attending: Family Medicine | Admitting: Family Medicine

## 2022-04-23 DIAGNOSIS — J302 Other seasonal allergic rhinitis: Secondary | ICD-10-CM

## 2022-04-23 DIAGNOSIS — J3089 Other allergic rhinitis: Secondary | ICD-10-CM | POA: Diagnosis not present

## 2022-04-23 DIAGNOSIS — M810 Age-related osteoporosis without current pathological fracture: Secondary | ICD-10-CM | POA: Diagnosis not present

## 2022-04-23 DIAGNOSIS — G4733 Obstructive sleep apnea (adult) (pediatric): Secondary | ICD-10-CM

## 2022-04-23 MED ORDER — ZOLEDRONIC ACID 5 MG/100ML IV SOLN
INTRAVENOUS | Status: AC
  Administered 2022-04-23: 5 mg via INTRAVENOUS
  Filled 2022-04-23: qty 100

## 2022-04-23 MED ORDER — ZOLEDRONIC ACID 5 MG/100ML IV SOLN
5.0000 mg | Freq: Once | INTRAVENOUS | Status: AC
Start: 2022-04-23 — End: 2022-04-23

## 2022-04-23 NOTE — Patient Instructions (Signed)
We can continue CPAP 10  Ok to get Sudafed for occasional use as a decongestant. Go to the pharmacy counter to ask for it. They will have you sign to register. Use it sparingly and it shouldn't be too hard on your blood pressure.  I hope your back and knee do ok.

## 2022-05-01 ENCOUNTER — Other Ambulatory Visit: Payer: Self-pay | Admitting: Family Medicine

## 2022-05-02 ENCOUNTER — Other Ambulatory Visit: Payer: Self-pay | Admitting: Family Medicine

## 2022-05-02 DIAGNOSIS — N644 Mastodynia: Secondary | ICD-10-CM | POA: Diagnosis not present

## 2022-05-02 DIAGNOSIS — I10 Essential (primary) hypertension: Secondary | ICD-10-CM | POA: Diagnosis not present

## 2022-05-02 DIAGNOSIS — E7800 Pure hypercholesterolemia, unspecified: Secondary | ICD-10-CM | POA: Diagnosis not present

## 2022-05-02 DIAGNOSIS — Z6839 Body mass index (BMI) 39.0-39.9, adult: Secondary | ICD-10-CM | POA: Diagnosis not present

## 2022-05-02 DIAGNOSIS — M810 Age-related osteoporosis without current pathological fracture: Secondary | ICD-10-CM | POA: Diagnosis not present

## 2022-05-02 DIAGNOSIS — M1711 Unilateral primary osteoarthritis, right knee: Secondary | ICD-10-CM | POA: Diagnosis not present

## 2022-05-02 DIAGNOSIS — G479 Sleep disorder, unspecified: Secondary | ICD-10-CM | POA: Diagnosis not present

## 2022-05-02 DIAGNOSIS — R7303 Prediabetes: Secondary | ICD-10-CM | POA: Diagnosis not present

## 2022-05-03 ENCOUNTER — Encounter: Payer: Self-pay | Admitting: Internal Medicine

## 2022-05-03 NOTE — Assessment & Plan Note (Signed)
Benefits from CPAP with good compliance and control Plan- continue fixed pressure at 10

## 2022-05-03 NOTE — Assessment & Plan Note (Signed)
Manages ok with steroid nasal sray and occasional Afrin which we discussed.

## 2022-05-10 ENCOUNTER — Other Ambulatory Visit: Payer: Self-pay | Admitting: Family Medicine

## 2022-05-10 DIAGNOSIS — N644 Mastodynia: Secondary | ICD-10-CM

## 2022-06-15 DIAGNOSIS — Z23 Encounter for immunization: Secondary | ICD-10-CM | POA: Diagnosis not present

## 2022-06-27 ENCOUNTER — Ambulatory Visit: Payer: PPO

## 2022-06-27 ENCOUNTER — Ambulatory Visit
Admission: RE | Admit: 2022-06-27 | Discharge: 2022-06-27 | Disposition: A | Discharge status: Home / Self Care | Payer: PPO | Source: Ambulatory Visit | Attending: Family Medicine | Admitting: Family Medicine

## 2022-06-27 DIAGNOSIS — N644 Mastodynia: Secondary | ICD-10-CM | POA: Diagnosis not present

## 2022-07-10 DIAGNOSIS — R051 Acute cough: Secondary | ICD-10-CM | POA: Diagnosis not present

## 2022-07-10 DIAGNOSIS — J0100 Acute maxillary sinusitis, unspecified: Secondary | ICD-10-CM | POA: Diagnosis not present

## 2022-08-02 DIAGNOSIS — Z96612 Presence of left artificial shoulder joint: Secondary | ICD-10-CM | POA: Diagnosis not present

## 2022-08-02 DIAGNOSIS — Z9889 Other specified postprocedural states: Secondary | ICD-10-CM | POA: Diagnosis not present

## 2022-08-02 DIAGNOSIS — Z471 Aftercare following joint replacement surgery: Secondary | ICD-10-CM | POA: Diagnosis not present

## 2022-08-02 DIAGNOSIS — M19011 Primary osteoarthritis, right shoulder: Secondary | ICD-10-CM | POA: Diagnosis not present

## 2022-08-16 ENCOUNTER — Telehealth: Payer: Self-pay | Admitting: Internal Medicine

## 2022-08-16 DIAGNOSIS — U071 COVID-19: Secondary | ICD-10-CM | POA: Diagnosis not present

## 2022-08-16 MED ORDER — NIRMATRELVIR/RITONAVIR (PAXLOVID)TABLET: 3.0000 | ORAL_TABLET | Freq: Two times a day (BID) | ORAL | 0 refills | Status: AC

## 2022-08-16 NOTE — Telephone Encounter (Signed)
Called and left detailed message for patient that we were sending in Paxlovid for her and to not take her Lipitor or her tumeric while taking this. Avoid [employer]. Sending to [facility]. Nothing further needed

## 2022-08-16 NOTE — Telephone Encounter (Signed)
PCP Kathyrn Lass, MD   She had normal creat' \\may'$  2023 when last checked.  Some not sure why she is calling our office to treat her COVID when she is seen Dr. Kelby Aline for sinus issues and she has primary care physician.  In any event please call in Paxlovid full dose for normal renal function.  During this time she just needs to hold her Lipitor and turmeric she should avoid any over-the-counter St. John's wort   PAXLOVID   Paxlovid (nirmatelvir 300/Ritonavir100) - BID x 5 days - for GFR >= 60  Paxlovid (nirmatelvir 150/ritonavir 100) - BID x 5 days - for GFR 30-60  Paxlovid - not recommended for GFR < 30   PLEASE CHECK MED LIST for the following issues. Please check the 2 different condition related to concomitant medications in alphabetical order  If Iverson Alamin Manfredonia with DOB [birth year] is on any of the following Strong CYP3A inhibtors - this patient Yenny Kosa Wilford should withold these concomitant meds so they can start paxlovid stratight away. If taking any of these:  alfuzosin, amiodarone, clozapine, colchicine, dihydroergotamine, dronedarone, ergotamine, flecainide, lovastatin, lurasidone, methylergonovine, midazolam [oral], pethidine, pimozide, propafenone, propoxyphene, quinidine, ranolazine, sildenafil simvastatin, triazolam).   If   Iverson Alamin Kinkead  with dob 08-31-1945 Is on any of these other strong CYP3A inducers then starting paxlovid should be delayed and the following meds should wash out first. These are dapalutamide, carbamazepine, phenobarbital, phenytoin, rifampin, St John's wort) - let me know immediately and we should delay starting paxlovid by some days even if he stops these medication.    PLEASE INFORM Iverson Alamin Berkheimer  OF FOLLOWING SIDE EFFECTS  Side effects - all < 5%  - skin rash (and veyr rare a conditon called TEN) - angiomedia  - myalgia - jaundice - high bP (1%) - loss of taste  - diarrhea    Allergies  Allergen  Reactions   Other Itching and Other (See Comments)    Perfumes cause irritation Slight irritation- unknown metal   Pollen Extract Other (See Comments)    Headache and Nasal Congestion. Allergy symptoms   Tape Rash    Other reaction(s): Other (See Comments) Causes redness      Current Outpatient Medications:    acetaminophen (TYLENOL) 500 MG tablet, Take 500 mg by mouth as needed., Disp: , Rfl:    acetaminophen (TYLENOL) 650 MG CR tablet, Take by mouth., Disp: , Rfl:    Ascorbic Acid (VITAMIN C) 1000 MG tablet, Take 1,000 mg by mouth every evening., Disp: , Rfl:    atorvastatin (LIPITOR) 10 MG tablet, Take 10 mg by mouth every evening. , Disp: , Rfl:    B Complex-C (B-COMPLEX WITH VITAMIN C) tablet, Take 1 tablet by mouth daily., Disp: , Rfl:    Calcium & Magnesium Carbonates (MYLANTA PO), Take 30 mLs by mouth daily as needed (constipation). , Disp: , Rfl:    calcium citrate-vitamin D (CITRACAL+D) 315-200 MG-UNIT tablet, Take 1 tablet by mouth 2 (two) times daily., Disp: , Rfl:    Cholecalciferol (VITAMIN D) 2000 units CAPS, Take 2,000 Units by mouth 2 (two) times daily. , Disp: , Rfl:    Famotidine (PEPCID PO), Take 1 tablet by mouth daily as needed (heartburn). , Disp: , Rfl:    famotidine-calcium carbonate-magnesium hydroxide (PEPCID COMPLETE) 10-800-165 MG chewable tablet, Chew 1 tablet by mouth daily as needed., Disp: , Rfl:    fexofenadine (ALLEGRA) 180 MG tablet, Take 90 mg by mouth as needed., Disp: ,  Rfl:    glucosamine-chondroitin 500-400 MG tablet, Take 1 tablet by mouth daily., Disp: , Rfl:    hydrochlorothiazide (MICROZIDE) 12.5 MG capsule, Take 12.5 mg by mouth every morning., Disp: , Rfl:    losartan (COZAAR) 100 MG tablet, Take 100 mg by mouth daily., Disp: , Rfl:    losartan-hydrochlorothiazide (HYZAAR) 100-12.5 MG per tablet, Take 1 tablet by mouth daily. , Disp: , Rfl:    Melatonin 5 MG TABS, Take 2 tablets by mouth at bedtime., Disp: , Rfl:    mesalamine (APRISO)  0.375 g 24 hr capsule, Take 1.5 g by mouth every morning., Disp: , Rfl:    metFORMIN (GLUCOPHAGE) 500 MG tablet, Take 500 mg by mouth 2 (two) times daily with a meal. , Disp: , Rfl:    montelukast (SINGULAIR) 10 MG tablet, Take 1 tablet (10 mg total) by mouth at bedtime. (Patient not taking: Reported on 04/23/2022), Disp: 30 tablet, Rfl: 11   Multiple Vitamins-Minerals (MULTIVITAMIN WITH MINERALS) tablet, Take 1 tablet by mouth daily., Disp: , Rfl:    oxybutynin (DITROPAN-XL) 5 MG 24 hr tablet, Take 5 mg by mouth daily., Disp: , Rfl:    pantoprazole (PROTONIX) 40 MG tablet, Take by mouth., Disp: , Rfl:    Polyethylene Glycol 400 (BLINK TEARS) 0.25 % SOLN, Place 1 drop into both eyes 3 (three)  times daily as needed for Dry Eyes., Disp: , Rfl:    Pseudoephedrine-Guaifenesin (MUCINEX D MAX STRENGTH) [phone] MG TB12, Take 1,200 mg by mouth as needed., Disp: , Rfl:    traMADol (ULTRAM) 50 MG tablet, Take 1 tablet (50 mg total) by mouth every 6 (six) hours as needed., Disp: 40 tablet, Rfl: 0   triamcinolone (NASACORT) 55 MCG/ACT AERO nasal inhaler, 1 puff in each nostril, Disp: , Rfl:    Triamcinolone Acetonide (NASACORT ALLERGY 24HR NA), Place 2 sprays into the nose at bedtime as needed (allergies). , Disp: , Rfl:    TURMERIC PO, Take 1 capsule by mouth daily., Disp: , Rfl:    zolpidem (AMBIEN) 5 MG tablet, Take 5 mg by mouth at bedtime as needed for sleep., Disp: , Rfl:

## 2022-08-24 ENCOUNTER — Telehealth: Payer: Self-pay | Admitting: Internal Medicine

## 2022-08-24 ENCOUNTER — Other Ambulatory Visit: Payer: Self-pay

## 2022-08-24 DIAGNOSIS — J342 Deviated nasal septum: Secondary | ICD-10-CM

## 2022-08-24 NOTE — Telephone Encounter (Signed)
With husband today for his appointment. She has hx septoplasty, but then broke her nose and now c/o persistent nasal stuffiness. Asks for ENT referral since Dr Ernesto Rutherford retired. Plan> Dr Fredric Dine

## 2022-09-21 DIAGNOSIS — N3944 Nocturnal enuresis: Secondary | ICD-10-CM | POA: Diagnosis not present

## 2022-09-21 DIAGNOSIS — R350 Frequency of micturition: Secondary | ICD-10-CM | POA: Diagnosis not present

## 2022-09-21 DIAGNOSIS — N3946 Mixed incontinence: Secondary | ICD-10-CM | POA: Diagnosis not present

## 2022-10-05 DIAGNOSIS — K5130 Ulcerative (chronic) rectosigmoiditis without complications: Secondary | ICD-10-CM | POA: Diagnosis not present

## 2022-10-05 DIAGNOSIS — N3946 Mixed incontinence: Secondary | ICD-10-CM | POA: Diagnosis not present

## 2022-10-05 DIAGNOSIS — R7303 Prediabetes: Secondary | ICD-10-CM | POA: Diagnosis not present

## 2022-10-05 DIAGNOSIS — I10 Essential (primary) hypertension: Secondary | ICD-10-CM | POA: Diagnosis not present

## 2022-10-05 DIAGNOSIS — M810 Age-related osteoporosis without current pathological fracture: Secondary | ICD-10-CM | POA: Diagnosis not present

## 2022-10-05 DIAGNOSIS — E7800 Pure hypercholesterolemia, unspecified: Secondary | ICD-10-CM | POA: Diagnosis not present

## 2022-10-05 DIAGNOSIS — M1711 Unilateral primary osteoarthritis, right knee: Secondary | ICD-10-CM | POA: Diagnosis not present

## 2022-10-05 DIAGNOSIS — G479 Sleep disorder, unspecified: Secondary | ICD-10-CM | POA: Diagnosis not present

## 2022-10-09 DIAGNOSIS — M1711 Unilateral primary osteoarthritis, right knee: Secondary | ICD-10-CM | POA: Diagnosis not present

## 2022-10-10 DIAGNOSIS — K5900 Constipation, unspecified: Secondary | ICD-10-CM | POA: Diagnosis not present

## 2022-10-10 DIAGNOSIS — K219 Gastro-esophageal reflux disease without esophagitis: Secondary | ICD-10-CM | POA: Diagnosis not present

## 2022-10-10 DIAGNOSIS — K5120 Ulcerative (chronic) proctitis without complications: Secondary | ICD-10-CM | POA: Diagnosis not present

## 2022-10-16 DIAGNOSIS — M1711 Unilateral primary osteoarthritis, right knee: Secondary | ICD-10-CM | POA: Diagnosis not present

## 2022-10-16 DIAGNOSIS — Z01811 Encounter for preprocedural respiratory examination: Secondary | ICD-10-CM | POA: Diagnosis not present

## 2022-10-16 DIAGNOSIS — J984 Other disorders of lung: Secondary | ICD-10-CM | POA: Diagnosis not present

## 2022-10-16 DIAGNOSIS — R079 Chest pain, unspecified: Secondary | ICD-10-CM | POA: Diagnosis not present

## 2022-10-16 DIAGNOSIS — Z01810 Encounter for preprocedural cardiovascular examination: Secondary | ICD-10-CM | POA: Diagnosis not present

## 2022-10-16 DIAGNOSIS — Z01812 Encounter for preprocedural laboratory examination: Secondary | ICD-10-CM | POA: Diagnosis not present

## 2022-10-16 DIAGNOSIS — R9431 Abnormal electrocardiogram [ECG] [EKG]: Secondary | ICD-10-CM | POA: Diagnosis not present

## 2022-10-16 DIAGNOSIS — Z01818 Encounter for other preprocedural examination: Secondary | ICD-10-CM | POA: Diagnosis not present

## 2022-10-16 DIAGNOSIS — Z96612 Presence of left artificial shoulder joint: Secondary | ICD-10-CM | POA: Diagnosis not present

## 2022-10-19 DIAGNOSIS — N3946 Mixed incontinence: Secondary | ICD-10-CM | POA: Diagnosis not present

## 2022-10-29 DIAGNOSIS — G4733 Obstructive sleep apnea (adult) (pediatric): Secondary | ICD-10-CM | POA: Diagnosis not present

## 2022-11-05 DIAGNOSIS — M25761 Osteophyte, right knee: Secondary | ICD-10-CM | POA: Diagnosis not present

## 2022-11-05 DIAGNOSIS — G8918 Other acute postprocedural pain: Secondary | ICD-10-CM | POA: Diagnosis not present

## 2022-11-05 DIAGNOSIS — Z79899 Other long term (current) drug therapy: Secondary | ICD-10-CM | POA: Diagnosis not present

## 2022-11-05 DIAGNOSIS — Z471 Aftercare following joint replacement surgery: Secondary | ICD-10-CM | POA: Diagnosis not present

## 2022-11-05 DIAGNOSIS — Z96651 Presence of right artificial knee joint: Secondary | ICD-10-CM | POA: Diagnosis not present

## 2022-11-05 DIAGNOSIS — M1711 Unilateral primary osteoarthritis, right knee: Secondary | ICD-10-CM | POA: Diagnosis not present

## 2022-11-06 DIAGNOSIS — M1711 Unilateral primary osteoarthritis, right knee: Secondary | ICD-10-CM | POA: Diagnosis not present

## 2022-11-07 DIAGNOSIS — M5136 Other intervertebral disc degeneration, lumbar region: Secondary | ICD-10-CM | POA: Diagnosis not present

## 2022-11-07 DIAGNOSIS — M1711 Unilateral primary osteoarthritis, right knee: Secondary | ICD-10-CM | POA: Diagnosis not present

## 2022-11-07 DIAGNOSIS — J3089 Other allergic rhinitis: Secondary | ICD-10-CM | POA: Diagnosis not present

## 2022-11-07 DIAGNOSIS — M1710 Unilateral primary osteoarthritis, unspecified knee: Secondary | ICD-10-CM | POA: Diagnosis not present

## 2022-11-07 DIAGNOSIS — M5126 Other intervertebral disc displacement, lumbar region: Secondary | ICD-10-CM | POA: Diagnosis not present

## 2022-11-07 DIAGNOSIS — Z7982 Long term (current) use of aspirin: Secondary | ICD-10-CM | POA: Diagnosis not present

## 2022-11-07 DIAGNOSIS — K51319 Ulcerative (chronic) rectosigmoiditis with unspecified complications: Secondary | ICD-10-CM | POA: Diagnosis not present

## 2022-11-07 DIAGNOSIS — E782 Mixed hyperlipidemia: Secondary | ICD-10-CM | POA: Diagnosis not present

## 2022-11-07 DIAGNOSIS — D7289 Other specified disorders of white blood cells: Secondary | ICD-10-CM | POA: Diagnosis not present

## 2022-11-07 DIAGNOSIS — Z96612 Presence of left artificial shoulder joint: Secondary | ICD-10-CM | POA: Diagnosis not present

## 2022-11-07 DIAGNOSIS — K449 Diaphragmatic hernia without obstruction or gangrene: Secondary | ICD-10-CM | POA: Diagnosis not present

## 2022-11-07 DIAGNOSIS — D638 Anemia in other chronic diseases classified elsewhere: Secondary | ICD-10-CM | POA: Diagnosis not present

## 2022-11-07 DIAGNOSIS — E559 Vitamin D deficiency, unspecified: Secondary | ICD-10-CM | POA: Diagnosis not present

## 2022-11-07 DIAGNOSIS — G4733 Obstructive sleep apnea (adult) (pediatric): Secondary | ICD-10-CM | POA: Diagnosis not present

## 2022-11-07 DIAGNOSIS — E6609 Other obesity due to excess calories: Secondary | ICD-10-CM | POA: Diagnosis not present

## 2022-11-07 DIAGNOSIS — E1165 Type 2 diabetes mellitus with hyperglycemia: Secondary | ICD-10-CM | POA: Diagnosis not present

## 2022-11-07 DIAGNOSIS — I10 Essential (primary) hypertension: Secondary | ICD-10-CM | POA: Diagnosis not present

## 2022-11-07 DIAGNOSIS — Z471 Aftercare following joint replacement surgery: Secondary | ICD-10-CM | POA: Diagnosis not present

## 2022-11-07 DIAGNOSIS — F5101 Primary insomnia: Secondary | ICD-10-CM | POA: Diagnosis not present

## 2022-11-07 DIAGNOSIS — M810 Age-related osteoporosis without current pathological fracture: Secondary | ICD-10-CM | POA: Diagnosis not present

## 2022-11-07 DIAGNOSIS — Z96651 Presence of right artificial knee joint: Secondary | ICD-10-CM | POA: Diagnosis not present

## 2022-11-08 DIAGNOSIS — E782 Mixed hyperlipidemia: Secondary | ICD-10-CM | POA: Diagnosis not present

## 2022-11-08 DIAGNOSIS — Z96651 Presence of right artificial knee joint: Secondary | ICD-10-CM | POA: Diagnosis not present

## 2022-11-08 DIAGNOSIS — I10 Essential (primary) hypertension: Secondary | ICD-10-CM | POA: Diagnosis not present

## 2022-11-08 DIAGNOSIS — E1165 Type 2 diabetes mellitus with hyperglycemia: Secondary | ICD-10-CM | POA: Diagnosis not present

## 2022-11-08 DIAGNOSIS — G4733 Obstructive sleep apnea (adult) (pediatric): Secondary | ICD-10-CM | POA: Diagnosis not present

## 2022-11-08 DIAGNOSIS — Z471 Aftercare following joint replacement surgery: Secondary | ICD-10-CM | POA: Diagnosis not present

## 2022-11-08 DIAGNOSIS — M1711 Unilateral primary osteoarthritis, right knee: Secondary | ICD-10-CM | POA: Diagnosis not present

## 2022-11-08 DIAGNOSIS — K51319 Ulcerative (chronic) rectosigmoiditis with unspecified complications: Secondary | ICD-10-CM | POA: Diagnosis not present

## 2022-11-08 DIAGNOSIS — F5101 Primary insomnia: Secondary | ICD-10-CM | POA: Diagnosis not present

## 2022-11-19 DIAGNOSIS — Z471 Aftercare following joint replacement surgery: Secondary | ICD-10-CM | POA: Diagnosis not present

## 2022-11-19 DIAGNOSIS — Z96651 Presence of right artificial knee joint: Secondary | ICD-10-CM | POA: Diagnosis not present

## 2022-11-19 DIAGNOSIS — R29898 Other symptoms and signs involving the musculoskeletal system: Secondary | ICD-10-CM | POA: Diagnosis not present

## 2022-11-19 DIAGNOSIS — M25661 Stiffness of right knee, not elsewhere classified: Secondary | ICD-10-CM | POA: Diagnosis not present

## 2022-11-19 DIAGNOSIS — Z89431 Acquired absence of right foot: Secondary | ICD-10-CM | POA: Diagnosis not present

## 2022-11-19 DIAGNOSIS — E669 Obesity, unspecified: Secondary | ICD-10-CM | POA: Diagnosis not present

## 2022-11-19 DIAGNOSIS — R2241 Localized swelling, mass and lump, right lower limb: Secondary | ICD-10-CM | POA: Diagnosis not present

## 2022-11-19 DIAGNOSIS — Z7982 Long term (current) use of aspirin: Secondary | ICD-10-CM | POA: Diagnosis not present

## 2022-11-19 DIAGNOSIS — M7989 Other specified soft tissue disorders: Secondary | ICD-10-CM | POA: Diagnosis not present

## 2022-11-19 DIAGNOSIS — M79661 Pain in right lower leg: Secondary | ICD-10-CM | POA: Diagnosis not present

## 2022-11-19 DIAGNOSIS — M25561 Pain in right knee: Secondary | ICD-10-CM | POA: Diagnosis not present

## 2022-11-22 DIAGNOSIS — M79661 Pain in right lower leg: Secondary | ICD-10-CM | POA: Diagnosis not present

## 2022-11-22 DIAGNOSIS — R29898 Other symptoms and signs involving the musculoskeletal system: Secondary | ICD-10-CM | POA: Diagnosis not present

## 2022-11-22 DIAGNOSIS — M25661 Stiffness of right knee, not elsewhere classified: Secondary | ICD-10-CM | POA: Diagnosis not present

## 2022-11-22 DIAGNOSIS — M7989 Other specified soft tissue disorders: Secondary | ICD-10-CM | POA: Diagnosis not present

## 2022-11-22 DIAGNOSIS — M25561 Pain in right knee: Secondary | ICD-10-CM | POA: Diagnosis not present

## 2022-11-27 DIAGNOSIS — M25661 Stiffness of right knee, not elsewhere classified: Secondary | ICD-10-CM | POA: Diagnosis not present

## 2022-11-27 DIAGNOSIS — M79661 Pain in right lower leg: Secondary | ICD-10-CM | POA: Diagnosis not present

## 2022-11-27 DIAGNOSIS — M25561 Pain in right knee: Secondary | ICD-10-CM | POA: Diagnosis not present

## 2022-11-27 DIAGNOSIS — R29898 Other symptoms and signs involving the musculoskeletal system: Secondary | ICD-10-CM | POA: Diagnosis not present

## 2022-11-27 DIAGNOSIS — M7989 Other specified soft tissue disorders: Secondary | ICD-10-CM | POA: Diagnosis not present

## 2022-11-29 DIAGNOSIS — Z96651 Presence of right artificial knee joint: Secondary | ICD-10-CM | POA: Diagnosis not present

## 2022-11-29 DIAGNOSIS — Z471 Aftercare following joint replacement surgery: Secondary | ICD-10-CM | POA: Diagnosis not present

## 2022-12-03 DIAGNOSIS — Z96651 Presence of right artificial knee joint: Secondary | ICD-10-CM | POA: Diagnosis not present

## 2022-12-03 DIAGNOSIS — Z471 Aftercare following joint replacement surgery: Secondary | ICD-10-CM | POA: Diagnosis not present

## 2022-12-06 DIAGNOSIS — Z96651 Presence of right artificial knee joint: Secondary | ICD-10-CM | POA: Diagnosis not present

## 2022-12-06 DIAGNOSIS — Z471 Aftercare following joint replacement surgery: Secondary | ICD-10-CM | POA: Diagnosis not present

## 2022-12-10 DIAGNOSIS — Z471 Aftercare following joint replacement surgery: Secondary | ICD-10-CM | POA: Diagnosis not present

## 2022-12-10 DIAGNOSIS — Z96651 Presence of right artificial knee joint: Secondary | ICD-10-CM | POA: Diagnosis not present

## 2022-12-12 DIAGNOSIS — Z96651 Presence of right artificial knee joint: Secondary | ICD-10-CM | POA: Diagnosis not present

## 2022-12-12 DIAGNOSIS — M79661 Pain in right lower leg: Secondary | ICD-10-CM | POA: Diagnosis not present

## 2022-12-12 DIAGNOSIS — Z471 Aftercare following joint replacement surgery: Secondary | ICD-10-CM | POA: Diagnosis not present

## 2022-12-12 DIAGNOSIS — M25561 Pain in right knee: Secondary | ICD-10-CM | POA: Diagnosis not present

## 2022-12-12 DIAGNOSIS — M25661 Stiffness of right knee, not elsewhere classified: Secondary | ICD-10-CM | POA: Diagnosis not present

## 2022-12-12 DIAGNOSIS — M7989 Other specified soft tissue disorders: Secondary | ICD-10-CM | POA: Diagnosis not present

## 2022-12-12 DIAGNOSIS — R29898 Other symptoms and signs involving the musculoskeletal system: Secondary | ICD-10-CM | POA: Diagnosis not present

## 2022-12-13 DIAGNOSIS — N3946 Mixed incontinence: Secondary | ICD-10-CM | POA: Diagnosis not present

## 2022-12-17 DIAGNOSIS — M79661 Pain in right lower leg: Secondary | ICD-10-CM | POA: Diagnosis not present

## 2022-12-17 DIAGNOSIS — M25661 Stiffness of right knee, not elsewhere classified: Secondary | ICD-10-CM | POA: Diagnosis not present

## 2022-12-17 DIAGNOSIS — M25561 Pain in right knee: Secondary | ICD-10-CM | POA: Diagnosis not present

## 2022-12-17 DIAGNOSIS — Z471 Aftercare following joint replacement surgery: Secondary | ICD-10-CM | POA: Diagnosis not present

## 2022-12-17 DIAGNOSIS — M7989 Other specified soft tissue disorders: Secondary | ICD-10-CM | POA: Diagnosis not present

## 2022-12-17 DIAGNOSIS — R29898 Other symptoms and signs involving the musculoskeletal system: Secondary | ICD-10-CM | POA: Diagnosis not present

## 2022-12-17 DIAGNOSIS — Z96651 Presence of right artificial knee joint: Secondary | ICD-10-CM | POA: Diagnosis not present

## 2022-12-19 DIAGNOSIS — Z471 Aftercare following joint replacement surgery: Secondary | ICD-10-CM | POA: Diagnosis not present

## 2022-12-19 DIAGNOSIS — M25561 Pain in right knee: Secondary | ICD-10-CM | POA: Diagnosis not present

## 2022-12-19 DIAGNOSIS — M79661 Pain in right lower leg: Secondary | ICD-10-CM | POA: Diagnosis not present

## 2022-12-19 DIAGNOSIS — R29898 Other symptoms and signs involving the musculoskeletal system: Secondary | ICD-10-CM | POA: Diagnosis not present

## 2022-12-19 DIAGNOSIS — M7989 Other specified soft tissue disorders: Secondary | ICD-10-CM | POA: Diagnosis not present

## 2022-12-19 DIAGNOSIS — Z96651 Presence of right artificial knee joint: Secondary | ICD-10-CM | POA: Diagnosis not present

## 2022-12-19 DIAGNOSIS — M25661 Stiffness of right knee, not elsewhere classified: Secondary | ICD-10-CM | POA: Diagnosis not present

## 2022-12-20 DIAGNOSIS — M25551 Pain in right hip: Secondary | ICD-10-CM | POA: Diagnosis not present

## 2022-12-20 DIAGNOSIS — Z96651 Presence of right artificial knee joint: Secondary | ICD-10-CM | POA: Diagnosis not present

## 2022-12-20 DIAGNOSIS — Z471 Aftercare following joint replacement surgery: Secondary | ICD-10-CM | POA: Diagnosis not present

## 2022-12-24 DIAGNOSIS — Z96651 Presence of right artificial knee joint: Secondary | ICD-10-CM | POA: Diagnosis not present

## 2022-12-24 DIAGNOSIS — Z471 Aftercare following joint replacement surgery: Secondary | ICD-10-CM | POA: Diagnosis not present

## 2022-12-26 DIAGNOSIS — Z471 Aftercare following joint replacement surgery: Secondary | ICD-10-CM | POA: Diagnosis not present

## 2022-12-26 DIAGNOSIS — Z96651 Presence of right artificial knee joint: Secondary | ICD-10-CM | POA: Diagnosis not present

## 2023-01-07 DIAGNOSIS — Z96651 Presence of right artificial knee joint: Secondary | ICD-10-CM | POA: Diagnosis not present

## 2023-01-07 DIAGNOSIS — Z471 Aftercare following joint replacement surgery: Secondary | ICD-10-CM | POA: Diagnosis not present

## 2023-01-09 DIAGNOSIS — Z96651 Presence of right artificial knee joint: Secondary | ICD-10-CM | POA: Diagnosis not present

## 2023-01-09 DIAGNOSIS — Z471 Aftercare following joint replacement surgery: Secondary | ICD-10-CM | POA: Diagnosis not present

## 2023-01-14 DIAGNOSIS — Z96651 Presence of right artificial knee joint: Secondary | ICD-10-CM | POA: Diagnosis not present

## 2023-01-14 DIAGNOSIS — Z471 Aftercare following joint replacement surgery: Secondary | ICD-10-CM | POA: Diagnosis not present

## 2023-01-16 DIAGNOSIS — M25561 Pain in right knee: Secondary | ICD-10-CM | POA: Diagnosis not present

## 2023-01-16 DIAGNOSIS — M7989 Other specified soft tissue disorders: Secondary | ICD-10-CM | POA: Diagnosis not present

## 2023-01-16 DIAGNOSIS — R29898 Other symptoms and signs involving the musculoskeletal system: Secondary | ICD-10-CM | POA: Diagnosis not present

## 2023-01-16 DIAGNOSIS — M79661 Pain in right lower leg: Secondary | ICD-10-CM | POA: Diagnosis not present

## 2023-01-16 DIAGNOSIS — M25661 Stiffness of right knee, not elsewhere classified: Secondary | ICD-10-CM | POA: Diagnosis not present

## 2023-01-31 DIAGNOSIS — H02834 Dermatochalasis of left upper eyelid: Secondary | ICD-10-CM | POA: Diagnosis not present

## 2023-01-31 DIAGNOSIS — H52223 Regular astigmatism, bilateral: Secondary | ICD-10-CM | POA: Diagnosis not present

## 2023-01-31 DIAGNOSIS — E1165 Type 2 diabetes mellitus with hyperglycemia: Secondary | ICD-10-CM | POA: Diagnosis not present

## 2023-01-31 DIAGNOSIS — H02831 Dermatochalasis of right upper eyelid: Secondary | ICD-10-CM | POA: Diagnosis not present

## 2023-01-31 DIAGNOSIS — H26491 Other secondary cataract, right eye: Secondary | ICD-10-CM | POA: Diagnosis not present

## 2023-01-31 DIAGNOSIS — H04123 Dry eye syndrome of bilateral lacrimal glands: Secondary | ICD-10-CM | POA: Diagnosis not present

## 2023-01-31 DIAGNOSIS — H5203 Hypermetropia, bilateral: Secondary | ICD-10-CM | POA: Diagnosis not present

## 2023-01-31 DIAGNOSIS — H43813 Vitreous degeneration, bilateral: Secondary | ICD-10-CM | POA: Diagnosis not present

## 2023-01-31 DIAGNOSIS — Z961 Presence of intraocular lens: Secondary | ICD-10-CM | POA: Diagnosis not present

## 2023-01-31 DIAGNOSIS — Z7984 Long term (current) use of oral hypoglycemic drugs: Secondary | ICD-10-CM | POA: Diagnosis not present

## 2023-01-31 DIAGNOSIS — H524 Presbyopia: Secondary | ICD-10-CM | POA: Diagnosis not present

## 2023-02-12 DIAGNOSIS — H26491 Other secondary cataract, right eye: Secondary | ICD-10-CM | POA: Diagnosis not present

## 2023-02-18 DIAGNOSIS — Z9181 History of falling: Secondary | ICD-10-CM | POA: Diagnosis not present

## 2023-02-18 DIAGNOSIS — M47816 Spondylosis without myelopathy or radiculopathy, lumbar region: Secondary | ICD-10-CM | POA: Diagnosis not present

## 2023-02-18 DIAGNOSIS — G894 Chronic pain syndrome: Secondary | ICD-10-CM | POA: Diagnosis not present

## 2023-02-18 DIAGNOSIS — Z6839 Body mass index (BMI) 39.0-39.9, adult: Secondary | ICD-10-CM | POA: Diagnosis not present

## 2023-02-19 DIAGNOSIS — N3944 Nocturnal enuresis: Secondary | ICD-10-CM | POA: Diagnosis not present

## 2023-02-19 DIAGNOSIS — R350 Frequency of micturition: Secondary | ICD-10-CM | POA: Diagnosis not present

## 2023-02-19 DIAGNOSIS — N3946 Mixed incontinence: Secondary | ICD-10-CM | POA: Diagnosis not present

## 2023-02-20 DIAGNOSIS — H52203 Unspecified astigmatism, bilateral: Secondary | ICD-10-CM | POA: Diagnosis not present

## 2023-02-20 DIAGNOSIS — H524 Presbyopia: Secondary | ICD-10-CM | POA: Diagnosis not present

## 2023-02-20 DIAGNOSIS — H04123 Dry eye syndrome of bilateral lacrimal glands: Secondary | ICD-10-CM | POA: Diagnosis not present

## 2023-02-20 DIAGNOSIS — H5203 Hypermetropia, bilateral: Secondary | ICD-10-CM | POA: Diagnosis not present

## 2023-02-20 DIAGNOSIS — H26491 Other secondary cataract, right eye: Secondary | ICD-10-CM | POA: Diagnosis not present

## 2023-03-05 DIAGNOSIS — H02834 Dermatochalasis of left upper eyelid: Secondary | ICD-10-CM | POA: Diagnosis not present

## 2023-03-05 DIAGNOSIS — H02831 Dermatochalasis of right upper eyelid: Secondary | ICD-10-CM | POA: Diagnosis not present

## 2023-03-15 DIAGNOSIS — U071 COVID-19: Secondary | ICD-10-CM | POA: Diagnosis not present

## 2023-04-16 DIAGNOSIS — Z0000 Encounter for general adult medical examination without abnormal findings: Secondary | ICD-10-CM | POA: Diagnosis not present

## 2023-04-16 DIAGNOSIS — E669 Obesity, unspecified: Secondary | ICD-10-CM | POA: Diagnosis not present

## 2023-04-16 DIAGNOSIS — Z1389 Encounter for screening for other disorder: Secondary | ICD-10-CM | POA: Diagnosis not present

## 2023-04-19 DIAGNOSIS — Z9989 Dependence on other enabling machines and devices: Secondary | ICD-10-CM | POA: Diagnosis not present

## 2023-04-19 DIAGNOSIS — M47816 Spondylosis without myelopathy or radiculopathy, lumbar region: Secondary | ICD-10-CM | POA: Diagnosis not present

## 2023-04-19 DIAGNOSIS — E7800 Pure hypercholesterolemia, unspecified: Secondary | ICD-10-CM | POA: Diagnosis not present

## 2023-04-19 DIAGNOSIS — M810 Age-related osteoporosis without current pathological fracture: Secondary | ICD-10-CM | POA: Diagnosis not present

## 2023-04-19 DIAGNOSIS — G479 Sleep disorder, unspecified: Secondary | ICD-10-CM | POA: Diagnosis not present

## 2023-04-19 DIAGNOSIS — K5190 Ulcerative colitis, unspecified, without complications: Secondary | ICD-10-CM | POA: Diagnosis not present

## 2023-04-19 DIAGNOSIS — R7303 Prediabetes: Secondary | ICD-10-CM | POA: Diagnosis not present

## 2023-04-19 DIAGNOSIS — Z6841 Body Mass Index (BMI) 40.0 and over, adult: Secondary | ICD-10-CM | POA: Diagnosis not present

## 2023-04-19 DIAGNOSIS — K219 Gastro-esophageal reflux disease without esophagitis: Secondary | ICD-10-CM | POA: Diagnosis not present

## 2023-04-19 DIAGNOSIS — I10 Essential (primary) hypertension: Secondary | ICD-10-CM | POA: Diagnosis not present

## 2023-04-23 DIAGNOSIS — N3946 Mixed incontinence: Secondary | ICD-10-CM | POA: Diagnosis not present

## 2023-04-24 ENCOUNTER — Other Ambulatory Visit (HOSPITAL_COMMUNITY): Payer: Self-pay | Admitting: *Deleted

## 2023-04-24 NOTE — Progress Notes (Unsigned)
Subjective:    Patient ID: [name] Finley, female    DOB: 1945/09/05, 78 y.o.   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 Had been cleared by cardiology. Ok with CPAP. Download reviewed. Breathing ok. Uses Nasacort at bedtime andd occasional, cautious use of Afrin.  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 HEENT:    headaches, difficulty swallowing, tooth/dental problems, sore throat,       sneezing, itching, ear ache, + nasal congestion, post nasal drip, snoring CV:    chest pain, orthopnea, PND, swelling in lower extremities, anasarca,                                               dizziness, palpitations Resp:   shortness of breath with exertion or at rest.                 productive cough,   non-productive cough, coughing up of blood.               change in color of mucus.  wheezing.   Skin:    rash or lesions. GI:  No-   heartburn, indigestion, abdominal pain, nausea, vomiting, diarrhea,                 change in bowel habits, loss of appetite GU: dysuria, change in color of urine, no urgency  or frequency.   flank pain. MS:   joint pain, stiffness, decreased range of motion, back pain. Neuro-     nothing unusual Psych:  change in mood or affect.  depression or anxiety.   memory loss.  .    Objective:  OBJ- Physical Exam General- Alert, Oriented, Affect-appropriate, Distress- none acute, + obese Skin- rash-none, lesions- none, excoriation- none Lymphadenopathy- none Head- atraumatic            Eyes- Gross vision intact, PERRLA, conjunctivae and secretions clear            Ears- Hearing, canals-normal            Nose- Clear, no-Septal dev, mucus, polyps, erosion, perforation            Throat- Mallampati II , mucosa clear , drainage- none, tonsils- atrophic Neck- flexible , trachea midline, no stridor , thyroid nl, carotid no bruit Chest - symmetrical excursion , unlabored           Heart/CV- RRR , no murmur , no gallop  , no rub, nl s1 s2                           -  JVD- none , edema- none, stasis changes- none, varices- none           Lung- clear to P&A, wheeze- none, cough- none , dullness-none, rub- none           Chest wall-  Abd-  Br/ Gen/ Rectal- Not done, not indicated Extrem- cyanosis- none, clubbing, none, atrophy- none, strength- nl Neuro- grossly intact to observation    Assessment & Plan:

## 2023-04-25 ENCOUNTER — Encounter: Payer: Self-pay | Admitting: Internal Medicine

## 2023-04-25 ENCOUNTER — Ambulatory Visit: Payer: PPO | Admitting: Internal Medicine

## 2023-04-25 ENCOUNTER — Encounter (HOSPITAL_COMMUNITY): Payer: PPO

## 2023-04-25 VITALS — BP 118/78 | HR 80 | Ht 61.5 in | Wt 215.6 lb

## 2023-04-25 DIAGNOSIS — J310 Chronic rhinitis: Secondary | ICD-10-CM | POA: Diagnosis not present

## 2023-04-25 DIAGNOSIS — G4733 Obstructive sleep apnea (adult) (pediatric): Secondary | ICD-10-CM | POA: Diagnosis not present

## 2023-04-25 DIAGNOSIS — J302 Other seasonal allergic rhinitis: Secondary | ICD-10-CM

## 2023-04-25 NOTE — Assessment & Plan Note (Signed)
Benefits from CPAP.  I would like better control.  We are going to try switching to AutoPap 5-15.  We will also ask [employer] to teach her how to adjust her humidifier.

## 2023-04-25 NOTE — Assessment & Plan Note (Signed)
Bothersome nasal stuffiness especially in the mornings.  She has not improved that using Flonase and Zyrtec.  She asks about ENT evaluation. Plan-refer to ENT.

## 2023-04-25 NOTE — Patient Instructions (Signed)
Order- DME Christoper Allegra- please change autopap to 5-15, and please instruct her how to adjust her humidifier  Order- referral to [facility] ENT - perennial nasal congestion

## 2023-05-03 DIAGNOSIS — N3946 Mixed incontinence: Secondary | ICD-10-CM | POA: Diagnosis not present

## 2023-05-07 DIAGNOSIS — M25552 Pain in left hip: Secondary | ICD-10-CM | POA: Diagnosis not present

## 2023-05-07 DIAGNOSIS — Z96651 Presence of right artificial knee joint: Secondary | ICD-10-CM | POA: Diagnosis not present

## 2023-05-08 ENCOUNTER — Ambulatory Visit (HOSPITAL_COMMUNITY)
Admission: RE | Admit: 2023-05-08 | Discharge: 2023-05-08 | Disposition: A | Discharge status: Home / Self Care | Payer: PPO | Source: Ambulatory Visit | Attending: Family Medicine | Admitting: Family Medicine

## 2023-05-08 DIAGNOSIS — M810 Age-related osteoporosis without current pathological fracture: Secondary | ICD-10-CM | POA: Diagnosis not present

## 2023-05-08 MED ORDER — ZOLEDRONIC ACID 5 MG/100ML IV SOLN: 5.0000 mg | Freq: Once | INTRAVENOUS | Status: AC

## 2023-05-08 MED ORDER — ZOLEDRONIC ACID 5 MG/100ML IV SOLN
INTRAVENOUS | Status: AC
  Administered 2023-05-08: 5 mg via INTRAVENOUS
  Filled 2023-05-08: qty 100

## 2023-05-14 DIAGNOSIS — H903 Sensorineural hearing loss, bilateral: Secondary | ICD-10-CM | POA: Diagnosis not present

## 2023-05-17 DIAGNOSIS — R350 Frequency of micturition: Secondary | ICD-10-CM | POA: Diagnosis not present

## 2023-05-17 DIAGNOSIS — N3946 Mixed incontinence: Secondary | ICD-10-CM | POA: Diagnosis not present

## 2023-05-21 ENCOUNTER — Encounter: Payer: Self-pay | Admitting: Internal Medicine

## 2023-05-22 DIAGNOSIS — Z6841 Body Mass Index (BMI) 40.0 and over, adult: Secondary | ICD-10-CM | POA: Diagnosis not present

## 2023-05-22 DIAGNOSIS — Z23 Encounter for immunization: Secondary | ICD-10-CM | POA: Diagnosis not present

## 2023-05-22 DIAGNOSIS — R7303 Prediabetes: Secondary | ICD-10-CM | POA: Diagnosis not present

## 2023-05-22 DIAGNOSIS — H6123 Impacted cerumen, bilateral: Secondary | ICD-10-CM | POA: Diagnosis not present

## 2023-05-24 DIAGNOSIS — H6121 Impacted cerumen, right ear: Secondary | ICD-10-CM | POA: Diagnosis not present

## 2023-05-29 ENCOUNTER — Other Ambulatory Visit: Payer: Self-pay | Admitting: Family Medicine

## 2023-05-29 DIAGNOSIS — Z1231 Encounter for screening mammogram for malignant neoplasm of breast: Secondary | ICD-10-CM

## 2023-06-17 ENCOUNTER — Encounter (INDEPENDENT_AMBULATORY_CARE_PROVIDER_SITE_OTHER): Payer: Self-pay | Admitting: Otolaryngology

## 2023-06-17 ENCOUNTER — Ambulatory Visit (INDEPENDENT_AMBULATORY_CARE_PROVIDER_SITE_OTHER): Payer: PPO | Admitting: Otolaryngology

## 2023-06-17 VITALS — BP 152/76 | HR 81 | Ht 61.5 in | Wt 217.0 lb

## 2023-06-17 DIAGNOSIS — J329 Chronic sinusitis, unspecified: Secondary | ICD-10-CM

## 2023-06-17 DIAGNOSIS — J343 Hypertrophy of nasal turbinates: Secondary | ICD-10-CM | POA: Diagnosis not present

## 2023-06-17 DIAGNOSIS — J3089 Other allergic rhinitis: Secondary | ICD-10-CM | POA: Diagnosis not present

## 2023-06-17 DIAGNOSIS — J342 Deviated nasal septum: Secondary | ICD-10-CM | POA: Diagnosis not present

## 2023-06-17 DIAGNOSIS — R0981 Nasal congestion: Secondary | ICD-10-CM | POA: Diagnosis not present

## 2023-06-17 DIAGNOSIS — J3489 Other specified disorders of nose and nasal sinuses: Secondary | ICD-10-CM

## 2023-06-17 NOTE — Progress Notes (Signed)
ENT CONSULT:  Reason for Consult:  chronic nasal congestion   HPI: [name] Finley is an 77 y.o. female with hx of OSA on CPAP, here for chronic nasal congestion and symptoms of nasal obstruction.  Hx of septoplasty and inferior turbinate reduction 15 yrs ago. She is here for chronic nasal congestion usually in Spring and Summer, every day, rhinorrhea and post-nasal drainage. She sometimes uses Afrin, but never for more than 1 to 2 days. She had septoplasty and inferior turbinate reduction and had significant improvement of nasal congestion symptoms following the procedure.  She takes Loratadine 10 mg daily and Flonase daily. She uses saline spray. She takes Pepcid 20 mg daily for GERD. She had allergy testing in [place], and reports positive testing at the time, severe per report.   Records Reviewed:  None    Past Medical History:  Diagnosis Date   Allergic rhinitis    Asthma    Hypertension    Sleep apnea     Past Surgical History:  Procedure Laterality Date   BREAST EXCISIONAL BIOPSY     BREAST SURGERY     NON MALIGNANT TISSUE REMOVED     Family History  Problem Relation Age of Onset   Allergies Father    Asthma Other        COUSIN    Social History:  reports that she has never smoked. She has never used smokeless tobacco. She reports that she does not drink alcohol and does not use drugs.  Allergies:  Allergies  Allergen Reactions   Other Itching and Other (See Comments)    Perfumes cause irritation Slight irritation- unknown metal   Pollen Extract Other (See Comments)    Headache and Nasal Congestion. Allergy symptoms   Tape Rash    Other reaction(s): Other (See Comments) Causes redness    Medications: I have reviewed the patient's current medications.  The PMH, PSH, Medications, Allergies, and SH were reviewed and updated.  ROS: Constitutional: Negative for fever, weight loss and weight gain. Cardiovascular: Negative for chest pain and dyspnea  on exertion. Respiratory: Is not experiencing shortness of breath at rest. Gastrointestinal: Negative for nausea and vomiting. Neurological: Negative for headaches. Psychiatric: The patient is not nervous/anxious  Blood pressure (!) 152/76, pulse 81, height 5' 1.5" (1.562 m), weight 217 lb (98.4 kg), SpO2 97%.  PHYSICAL EXAM:  Exam: General: Well-developed, well-nourished Respiratory Respiratory effort: Equal inspiration and expiration without stridor Cardiovascular Peripheral Vascular: Warm extremities with equal color/perfusion Eyes: No nystagmus with equal extraocular motion bilaterally Neuro/Psych/Balance: Patient oriented to person, place, and time; Appropriate mood and affect; Gait is intact with no imbalance; Cranial nerves I-XII are intact Head and Face Inspection: Normocephalic and atraumatic without mass or lesion Palpation: Facial skeleton intact without bony stepoffs Salivary Glands: No mass or tenderness Facial Strength: Facial motility symmetric and full bilaterally ENT Pinna: External ear intact and fully developed External canal: Canal is patent with intact skin Tympanic Membrane: Clear and mobile External Nose: No scar or anatomic deformity Internal Nose: Septum is slight deviation to the left. No polyp, or purulence. Mucosal edema and erythema present.  Bilateral inferior turbinate hypertrophy.  Lips, Teeth, and gums: Mucosa and teeth intact and viable TMJ: No pain to palpation with full mobility Oral cavity/oropharynx: No erythema or exudate, no lesions present Nasopharynx: No mass or lesion with intact mucosa Neck Neck and Trachea: Midline trachea without mass or lesion Thyroid: No mass or nodularity Lymphatics: No lymphadenopathy  Procedure:   PROCEDURE NOTE: nasal  endoscopy  Preoperative diagnosis: chronic sinusitis symptoms  Postoperative diagnosis: same  Procedure: Diagnostic nasal endoscopy (21308)  Surgeon: Ashok Croon,  M.D.  Anesthesia: Topical lidocaine and Afrin  H&P REVIEW: The patient's history and physical were reviewed today prior to procedure. All medications were reviewed and updated as well. Complications: None Condition is stable throughout exam Indications and consent: The patient presents with symptoms of chronic sinusitis not responding to previous therapies. All the risks, benefits, and potential complications were reviewed with the patient preoperatively and informed consent was obtained. The time out was completed with confirmation of the correct procedure.   Procedure: The patient was seated upright in the clinic. Topical lidocaine and Afrin were applied to the nasal cavity. After adequate anesthesia had occurred, the rigid nasal endoscope was passed into the nasal cavity. The nasal mucosa, turbinates, septum, and sinus drainage pathways were visualized bilaterally. This revealed no purulence or significant secretions that might be cultured. There were no polyps or sites of significant inflammation. The mucosa was intact and there was no crusting present. The scope was then slowly withdrawn and the patient tolerated the procedure well. There were no complications or blood loss.   Studies Reviewed: 04/12/2019 CLINICAL DATA:  Forehead redness and swelling and dried blood on the nose after falling today on asphalt. EXAM: CT HEAD WITHOUT CONTRAST   CT MAXILLOFACIAL WITHOUT CONTRAST   CT CERVICAL SPINE WITHOUT CONTRAST   TECHNIQUE: Multidetector CT imaging of the head, cervical spine, and maxillofacial structures were performed using the standard protocol without intravenous contrast. Multiplanar CT image reconstructions of the cervical spine and maxillofacial structures were also generated.   COMPARISON:  None.   FINDINGS: CT HEAD FINDINGS   Brain: Mild-to-moderate enlargement of the ventricles and subarachnoid spaces. Minimal patchy white matter low density in both cerebral  hemispheres. No intracranial hemorrhage, mass lesion or CT evidence of acute infarction.   Vascular: No hyperdense vessel or unexpected calcification.   Skull: Normal. Negative for fracture or focal lesion.   Other: Mild right forehead hematoma. Moderate right TMJ degenerative changes.   CT MAXILLOFACIAL FINDINGS   Osseous: Nondepressed fracture of the tip of the nasal bone. The anterior maxillary spine is intact. Moderate right TMJ degenerative changes.   Orbits: Status post bilateral cataract extraction. Otherwise, unremarkable orbits.   Sinuses: Normally pneumatized.   Soft tissues: Anterior and right nasal soft tissue air and mild irregularity compatible with a laceration.   CT CERVICAL SPINE FINDINGS   Alignment: Mild kyphosis and mild anterolisthesis at the C4-5 level.   Skull base and vertebrae: No acute fracture. No primary bone lesion or focal pathologic process.   Soft tissues and spinal canal: No prevertebral fluid or swelling. No visible canal hematoma.   Disc levels: Posterior element fusion on the right at the C2-3 and C3-4 levels. Facet degenerative changes throughout the cervical and upper thoracic spine. These are most pronounced at the C4-5 level and in the upper thoracic spine.   Upper chest: Clear lung apices.   Other: None.   IMPRESSION: 1. Nondepressed fracture of the tip of the nasal bone. 2. No skull fracture or intracranial hemorrhage. 3. No cervical spine fracture or traumatic subluxation. 4. Mild to moderate diffuse cerebral and cerebellar atrophy. 5. Minimal chronic small vessel white matter ischemic changes in both cerebral hemispheres. 6. Multilevel cervical spine degenerative changes. 7. Moderate right TMJ degenerative changes. 8. Mild right frontal scalp hematoma and right nasal laceration.    Assessment/Plan: Encounter Diagnoses  Name Primary?  Chronic sinusitis, unspecified location Yes   Hypertrophy of both inferior nasal  turbinates    Nasal obstruction    Nasal septal deviation    Environmental and seasonal allergies    Chronic nasal congestion    Chronic nasal congestion nasal obstruction with history of septoplasty and ITR several years ago with initial improvement in her symptoms but recurrence of symptoms.  Nasal endoscopy with evidence of inferior turban hypertrophy and mild narrowing of the left nasal passage due to septal deviation.  History of severe allergies on allergy testing when [place], status post immunotherapy.  Reports frequent sinus infections in the past.  - start nasal saline rinses - continue Claritin 10 mg daily  - continue Nasocort 2 puffs b/l nares BID -Obtain sinus CT to rule out chronic sinus inflammation  2.  Nasal congestion in the setting of environmental allergies.  We discussed allergy testing due to presence of different allergens relative to the environment in [place] where she was tested. - would like to hold off on Allergy testing -Medical management as above  3.  Evidence of inferior turban hypertrophy on nasal endoscopy today -Will consider reduction if fails maximal medical management  Thank you for allowing me to participate in the care of this patient. Please do not hesitate to contact me with any questions or concerns.   Ashok Croon, MD Otolaryngology [facility] Phone: [phone] Fax: [phone]    06/17/2023, 8:25 PM

## 2023-06-19 DIAGNOSIS — H02834 Dermatochalasis of left upper eyelid: Secondary | ICD-10-CM | POA: Diagnosis not present

## 2023-06-19 DIAGNOSIS — H02831 Dermatochalasis of right upper eyelid: Secondary | ICD-10-CM | POA: Diagnosis not present

## 2023-07-05 ENCOUNTER — Ambulatory Visit (HOSPITAL_COMMUNITY)
Admission: RE | Admit: 2023-07-05 | Discharge: 2023-07-05 | Disposition: A | Discharge status: Home / Self Care | Payer: PPO | Source: Ambulatory Visit | Attending: Otolaryngology

## 2023-07-05 DIAGNOSIS — R0981 Nasal congestion: Secondary | ICD-10-CM | POA: Diagnosis not present

## 2023-07-05 DIAGNOSIS — J329 Chronic sinusitis, unspecified: Secondary | ICD-10-CM | POA: Insufficient documentation

## 2023-07-09 ENCOUNTER — Ambulatory Visit: Payer: PPO

## 2023-07-09 ENCOUNTER — Ambulatory Visit
Admission: RE | Admit: 2023-07-09 | Discharge: 2023-07-09 | Disposition: A | Discharge status: Home / Self Care | Payer: PPO | Source: Ambulatory Visit | Attending: Family Medicine | Admitting: Family Medicine

## 2023-07-09 DIAGNOSIS — Z1231 Encounter for screening mammogram for malignant neoplasm of breast: Secondary | ICD-10-CM

## 2023-07-10 ENCOUNTER — Ambulatory Visit: Payer: PPO

## 2023-07-15 DIAGNOSIS — L821 Other seborrheic keratosis: Secondary | ICD-10-CM | POA: Diagnosis not present

## 2023-07-15 DIAGNOSIS — D2271 Melanocytic nevi of right lower limb, including hip: Secondary | ICD-10-CM | POA: Diagnosis not present

## 2023-07-15 DIAGNOSIS — L820 Inflamed seborrheic keratosis: Secondary | ICD-10-CM | POA: Diagnosis not present

## 2023-07-15 DIAGNOSIS — D225 Melanocytic nevi of trunk: Secondary | ICD-10-CM | POA: Diagnosis not present

## 2023-07-15 DIAGNOSIS — D485 Neoplasm of uncertain behavior of skin: Secondary | ICD-10-CM | POA: Diagnosis not present

## 2023-07-19 DIAGNOSIS — K5130 Ulcerative (chronic) rectosigmoiditis without complications: Secondary | ICD-10-CM | POA: Diagnosis not present

## 2023-07-19 DIAGNOSIS — R140 Abdominal distension (gaseous): Secondary | ICD-10-CM | POA: Diagnosis not present

## 2023-07-19 DIAGNOSIS — K219 Gastro-esophageal reflux disease without esophagitis: Secondary | ICD-10-CM | POA: Diagnosis not present

## 2023-08-12 DIAGNOSIS — Z6839 Body mass index (BMI) 39.0-39.9, adult: Secondary | ICD-10-CM | POA: Diagnosis not present

## 2023-08-12 DIAGNOSIS — Z9989 Dependence on other enabling machines and devices: Secondary | ICD-10-CM | POA: Diagnosis not present

## 2023-08-12 DIAGNOSIS — K5130 Ulcerative (chronic) rectosigmoiditis without complications: Secondary | ICD-10-CM | POA: Diagnosis not present

## 2023-08-12 DIAGNOSIS — N3000 Acute cystitis without hematuria: Secondary | ICD-10-CM | POA: Diagnosis not present

## 2023-08-15 DIAGNOSIS — M19012 Primary osteoarthritis, left shoulder: Secondary | ICD-10-CM | POA: Diagnosis not present

## 2023-08-15 DIAGNOSIS — M19011 Primary osteoarthritis, right shoulder: Secondary | ICD-10-CM | POA: Diagnosis not present

## 2023-08-15 DIAGNOSIS — Z471 Aftercare following joint replacement surgery: Secondary | ICD-10-CM | POA: Diagnosis not present

## 2023-08-15 DIAGNOSIS — Z96612 Presence of left artificial shoulder joint: Secondary | ICD-10-CM | POA: Diagnosis not present

## 2023-08-26 DIAGNOSIS — L820 Inflamed seborrheic keratosis: Secondary | ICD-10-CM | POA: Diagnosis not present

## 2023-08-26 DIAGNOSIS — X32XXXD Exposure to sunlight, subsequent encounter: Secondary | ICD-10-CM | POA: Diagnosis not present

## 2023-08-26 DIAGNOSIS — L570 Actinic keratosis: Secondary | ICD-10-CM | POA: Diagnosis not present

## 2023-08-26 DIAGNOSIS — D23121 Other benign neoplasm of skin of left upper eyelid, including canthus: Secondary | ICD-10-CM | POA: Diagnosis not present

## 2023-10-02 DIAGNOSIS — R8271 Bacteriuria: Secondary | ICD-10-CM | POA: Diagnosis not present

## 2023-10-28 DIAGNOSIS — R350 Frequency of micturition: Secondary | ICD-10-CM | POA: Diagnosis not present

## 2023-11-05 DIAGNOSIS — Z96651 Presence of right artificial knee joint: Secondary | ICD-10-CM | POA: Diagnosis not present

## 2023-11-08 DIAGNOSIS — N3946 Mixed incontinence: Secondary | ICD-10-CM | POA: Diagnosis not present

## 2023-11-21 DIAGNOSIS — G4733 Obstructive sleep apnea (adult) (pediatric): Secondary | ICD-10-CM | POA: Diagnosis not present

## 2023-11-22 DIAGNOSIS — N3946 Mixed incontinence: Secondary | ICD-10-CM | POA: Diagnosis not present

## 2023-12-18 DIAGNOSIS — B079 Viral wart, unspecified: Secondary | ICD-10-CM | POA: Diagnosis not present

## 2023-12-18 DIAGNOSIS — B078 Other viral warts: Secondary | ICD-10-CM | POA: Diagnosis not present

## 2024-01-03 DIAGNOSIS — K5130 Ulcerative (chronic) rectosigmoiditis without complications: Secondary | ICD-10-CM | POA: Diagnosis not present

## 2024-01-22 DIAGNOSIS — L438 Other lichen planus: Secondary | ICD-10-CM | POA: Diagnosis not present

## 2024-01-22 DIAGNOSIS — B078 Other viral warts: Secondary | ICD-10-CM | POA: Diagnosis not present

## 2024-02-03 DIAGNOSIS — E66813 Obesity, class 3: Secondary | ICD-10-CM | POA: Diagnosis not present

## 2024-02-03 DIAGNOSIS — G4733 Obstructive sleep apnea (adult) (pediatric): Secondary | ICD-10-CM | POA: Diagnosis not present

## 2024-02-03 DIAGNOSIS — I10 Essential (primary) hypertension: Secondary | ICD-10-CM | POA: Diagnosis not present

## 2024-02-03 DIAGNOSIS — Z1330 Encounter for screening examination for mental health and behavioral disorders, unspecified: Secondary | ICD-10-CM | POA: Diagnosis not present

## 2024-02-03 DIAGNOSIS — Z6841 Body Mass Index (BMI) 40.0 and over, adult: Secondary | ICD-10-CM | POA: Diagnosis not present

## 2024-02-03 DIAGNOSIS — K219 Gastro-esophageal reflux disease without esophagitis: Secondary | ICD-10-CM | POA: Diagnosis not present

## 2024-02-24 DIAGNOSIS — I10 Essential (primary) hypertension: Secondary | ICD-10-CM | POA: Diagnosis not present

## 2024-02-24 DIAGNOSIS — E669 Obesity, unspecified: Secondary | ICD-10-CM | POA: Diagnosis not present

## 2024-02-24 DIAGNOSIS — M1711 Unilateral primary osteoarthritis, right knee: Secondary | ICD-10-CM | POA: Diagnosis not present

## 2024-02-25 DIAGNOSIS — H43813 Vitreous degeneration, bilateral: Secondary | ICD-10-CM | POA: Diagnosis not present

## 2024-02-25 DIAGNOSIS — H02831 Dermatochalasis of right upper eyelid: Secondary | ICD-10-CM | POA: Diagnosis not present

## 2024-02-25 DIAGNOSIS — H02834 Dermatochalasis of left upper eyelid: Secondary | ICD-10-CM | POA: Diagnosis not present

## 2024-02-25 DIAGNOSIS — Z961 Presence of intraocular lens: Secondary | ICD-10-CM | POA: Diagnosis not present

## 2024-02-25 DIAGNOSIS — H52223 Regular astigmatism, bilateral: Secondary | ICD-10-CM | POA: Diagnosis not present

## 2024-02-25 DIAGNOSIS — H04123 Dry eye syndrome of bilateral lacrimal glands: Secondary | ICD-10-CM | POA: Diagnosis not present

## 2024-02-25 DIAGNOSIS — Z7984 Long term (current) use of oral hypoglycemic drugs: Secondary | ICD-10-CM | POA: Diagnosis not present

## 2024-02-25 DIAGNOSIS — E1165 Type 2 diabetes mellitus with hyperglycemia: Secondary | ICD-10-CM | POA: Diagnosis not present

## 2024-03-10 DIAGNOSIS — L910 Hypertrophic scar: Secondary | ICD-10-CM | POA: Diagnosis not present

## 2024-04-03 DIAGNOSIS — R350 Frequency of micturition: Secondary | ICD-10-CM | POA: Diagnosis not present

## 2024-04-06 DIAGNOSIS — I10 Essential (primary) hypertension: Secondary | ICD-10-CM | POA: Diagnosis not present

## 2024-04-06 DIAGNOSIS — E7800 Pure hypercholesterolemia, unspecified: Secondary | ICD-10-CM | POA: Diagnosis not present

## 2024-04-06 DIAGNOSIS — G894 Chronic pain syndrome: Secondary | ICD-10-CM | POA: Diagnosis not present

## 2024-04-06 DIAGNOSIS — R7303 Prediabetes: Secondary | ICD-10-CM | POA: Diagnosis not present

## 2024-04-06 DIAGNOSIS — M810 Age-related osteoporosis without current pathological fracture: Secondary | ICD-10-CM | POA: Diagnosis not present

## 2024-04-06 DIAGNOSIS — G479 Sleep disorder, unspecified: Secondary | ICD-10-CM | POA: Diagnosis not present

## 2024-04-06 DIAGNOSIS — M47816 Spondylosis without myelopathy or radiculopathy, lumbar region: Secondary | ICD-10-CM | POA: Diagnosis not present

## 2024-04-09 DIAGNOSIS — E66813 Obesity, class 3: Secondary | ICD-10-CM | POA: Diagnosis not present

## 2024-04-09 DIAGNOSIS — Z6841 Body Mass Index (BMI) 40.0 and over, adult: Secondary | ICD-10-CM | POA: Diagnosis not present

## 2024-04-09 DIAGNOSIS — G4733 Obstructive sleep apnea (adult) (pediatric): Secondary | ICD-10-CM | POA: Diagnosis not present

## 2024-04-13 ENCOUNTER — Telehealth: Payer: Self-pay

## 2024-04-13 ENCOUNTER — Other Ambulatory Visit: Payer: Self-pay

## 2024-04-13 DIAGNOSIS — M810 Age-related osteoporosis without current pathological fracture: Secondary | ICD-10-CM | POA: Insufficient documentation

## 2024-04-13 NOTE — Telephone Encounter (Signed)
 Dr. Cleotilde, patient will be scheduled as soon as possible.  Auth Submission: NO AUTH NEEDED Site of care: Site of care: [facility] Payer: [employer] Medication & CPT/J Code(s) submitted: Reclast  (Zolendronic acid) J3489 Diagnosis Code:  Route of submission (phone, fax, portal):  Phone # Fax # Auth type: Buy/Bill PB Units/visits requested: 5mg  x 1 dose Reference number:  Approval from: 04/13/24 to 08/26/24

## 2024-04-14 DIAGNOSIS — N3946 Mixed incontinence: Secondary | ICD-10-CM | POA: Diagnosis not present

## 2024-04-20 NOTE — Progress Notes (Signed)
 Subjective:    Patient ID: [name] Finley, female    DOB: 21-Nov-1945, 78 y.o.   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 States she can't get another until 2026 Apparently [employer] did not have heated hoses to fit her machine. She is using a nonheated hose and does not know how to adjust her humidifier.  She complains of perennial stuffy nose with remote history of turbinate reduction.  She asks about referral to ENT for check on this.  She has been using Flonase and Zyrtec. We had suggested trial of Sudafed decongestant last visit, which she did not do.  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  Using nasal pillow. Tolerates the high leak and prefers her nasal pillows over other mask styles. Discussed the use of AI scribe software for clinical note transcription with the patient, who gave verbal consent to proceed.  History of Present Illness   [name] Finley is a 78 year old female with sleep apnea who presents for follow-up regarding CPAP use and sleep issues.  She experiences ongoing sleep difficulties and suspects that a  weight loss medication from the [facility] may be affecting her sleep. She takes the medication in the morning and is on the lowest dose. She cannot recall the name but believes it starts with a 'P' or 'F'.  She uses a CPAP machine with a nasal pillow setup and cannot sleep without it. The nasal pillow is more comfortable than a full mask, though she experiences occasional leaks due to strap slippage. A large pillow size is used to accommodate a bone spur in her nose, preventing irritation.     Assessment and Plan:    Obstructive sleep apnea managed with CPAP Obstructive sleep apnea managed with nasal pillow CPAP. Reports acceptable control despite high leak. Prefers nasal pillow for comfort due to nasal bone spur. Current setup is medically acceptable. - Continue current CPAP setup with nasal pillow.  Insomnia possibly related to weight loss medication Insomnia may be linked to new weight loss medication from [facility], taken in the morning at lowest dose. Medication name unclear, possibly starting with 'P' or 'F'. Considering discontinuation if sleep is affected.     ROS-see HPI   + = positive Constitutional:    weight loss, night sweats, fevers, chills, fatigue, lassitude. HEENT:    headaches, difficulty swallowing, tooth/dental problems, sore throat,       sneezing, itching, ear ache, + nasal congestion, post nasal drip, snoring CV:    chest pain, orthopnea, PND, swelling in lower extremities, anasarca,  dizziness, palpitations Resp:   shortness of breath with exertion or at rest.                 productive cough,   non-productive cough, coughing up of blood.               change in color of mucus.  wheezing.   Skin:    rash or lesions. GI:  No-   heartburn, indigestion, abdominal pain, nausea, vomiting, diarrhea,                 change in bowel habits, loss of appetite GU: dysuria, change in color of urine, no  urgency or frequency.   flank pain. MS:   joint pain, stiffness, decreased range of motion, back pain. Neuro-     nothing unusual Psych:  change in mood or affect.  depression or anxiety.   memory loss.  .    Objective:  OBJ- Physical Exam General- Alert, Oriented, Affect-appropriate, Distress- none acute, + obese Skin- rash-none, lesions- none, excoriation- none Lymphadenopathy- none Head- atraumatic            Eyes- Gross vision intact, PERRLA, conjunctivae and secretions clear            Ears- Hearing, canals-normal            Nose- Clear, no-Septal dev, mucus, polyps, erosion, perforation            Throat- Mallampati II , mucosa clear , drainage- none, tonsils- atrophic Neck- flexible , trachea midline, no stridor , thyroid  nl, carotid no bruit Chest - symmetrical excursion , unlabored           Heart/CV- RRR , no murmur , no gallop  , no rub, nl s1 s2                           - JVD- none , edema- none, stasis changes- none, varices- none           Lung- clear to P&A, wheeze- none, cough- none , dullness-none, rub- none           Chest wall-  Abd-  Br/ Gen/ Rectal- Not done, not indicated Extrem- cyanosis- none, clubbing, none, atrophy- none, strength- nl Neuro- grossly intact to observation    Assessment & Plan:

## 2024-04-21 ENCOUNTER — Ambulatory Visit: Payer: PPO | Admitting: Internal Medicine

## 2024-04-21 ENCOUNTER — Encounter: Payer: Self-pay | Admitting: Internal Medicine

## 2024-04-21 VITALS — BP 124/72 | HR 76 | Temp 97.6°F | Ht 61.5 in | Wt 208.0 lb

## 2024-04-21 DIAGNOSIS — G4733 Obstructive sleep apnea (adult) (pediatric): Secondary | ICD-10-CM

## 2024-04-21 NOTE — Patient Instructions (Signed)
 We can continue CPAP auto 5-15. We will watch the mask leak issue, but since you prefer nasal pillows. We can stay with that for now.

## 2024-04-26 DIAGNOSIS — I10 Essential (primary) hypertension: Secondary | ICD-10-CM | POA: Diagnosis not present

## 2024-04-26 DIAGNOSIS — E669 Obesity, unspecified: Secondary | ICD-10-CM | POA: Diagnosis not present

## 2024-04-26 DIAGNOSIS — M1711 Unilateral primary osteoarthritis, right knee: Secondary | ICD-10-CM | POA: Diagnosis not present

## 2024-04-28 DIAGNOSIS — N3944 Nocturnal enuresis: Secondary | ICD-10-CM | POA: Diagnosis not present

## 2024-04-28 DIAGNOSIS — N3946 Mixed incontinence: Secondary | ICD-10-CM | POA: Diagnosis not present

## 2024-05-11 ENCOUNTER — Ambulatory Visit (INDEPENDENT_AMBULATORY_CARE_PROVIDER_SITE_OTHER)

## 2024-05-11 VITALS — BP 130/79 | HR 64 | Temp 97.8°F | Resp 16 | Ht 61.5 in | Wt 210.6 lb

## 2024-05-11 DIAGNOSIS — M810 Age-related osteoporosis without current pathological fracture: Secondary | ICD-10-CM

## 2024-05-11 MED ORDER — SODIUM CHLORIDE 0.9 % IV SOLN: INTRAVENOUS | Status: DC

## 2024-05-11 MED ORDER — DIPHENHYDRAMINE HCL 25 MG PO CAPS
25.0000 mg | ORAL_CAPSULE | Freq: Once | ORAL | Status: DC
Start: 2024-05-11 — End: 2024-05-11

## 2024-05-11 MED ORDER — ZOLEDRONIC ACID 5 MG/100ML IV SOLN
5.0000 mg | Freq: Once | INTRAVENOUS | Status: AC
  Administered 2024-05-11: 5 mg via INTRAVENOUS
  Filled 2024-05-11: qty 100

## 2024-05-11 MED ORDER — ACETAMINOPHEN 325 MG PO TABS
650.0000 mg | ORAL_TABLET | Freq: Once | ORAL | Status: DC
Start: 2024-05-11 — End: 2024-05-11

## 2024-05-11 NOTE — Progress Notes (Signed)
 Diagnosis: Osteoporosis  Provider:  Lonna Coder MD  Procedure: IV Infusion  IV Type: Peripheral, IV Location: R Antecubital  Reclast  (Zolendronic Acid), Dose: 5 mg  Infusion Start Time: 1351  Infusion Stop Time: 1427  Post Infusion IV Care: Patient declined observation and Peripheral IV Discontinued  Discharge: Condition: Good, Destination: Home . AVS Declined  Performed by:  Leita FORBES Miles, LPN

## 2024-05-26 DIAGNOSIS — I10 Essential (primary) hypertension: Secondary | ICD-10-CM | POA: Diagnosis not present

## 2024-05-26 DIAGNOSIS — E669 Obesity, unspecified: Secondary | ICD-10-CM | POA: Diagnosis not present

## 2024-05-26 DIAGNOSIS — M1711 Unilateral primary osteoarthritis, right knee: Secondary | ICD-10-CM | POA: Diagnosis not present

## 2024-05-27 DIAGNOSIS — G4733 Obstructive sleep apnea (adult) (pediatric): Secondary | ICD-10-CM | POA: Diagnosis not present

## 2024-06-03 DIAGNOSIS — K5130 Ulcerative (chronic) rectosigmoiditis without complications: Secondary | ICD-10-CM | POA: Diagnosis not present

## 2024-06-03 DIAGNOSIS — K219 Gastro-esophageal reflux disease without esophagitis: Secondary | ICD-10-CM | POA: Diagnosis not present

## 2024-06-03 DIAGNOSIS — K5900 Constipation, unspecified: Secondary | ICD-10-CM | POA: Diagnosis not present

## 2024-06-09 DIAGNOSIS — Z6838 Body mass index (BMI) 38.0-38.9, adult: Secondary | ICD-10-CM | POA: Diagnosis not present

## 2024-06-09 DIAGNOSIS — E66812 Obesity, class 2: Secondary | ICD-10-CM | POA: Diagnosis not present

## 2024-06-15 ENCOUNTER — Other Ambulatory Visit: Payer: Self-pay | Admitting: Family Medicine

## 2024-06-15 DIAGNOSIS — Z1231 Encounter for screening mammogram for malignant neoplasm of breast: Secondary | ICD-10-CM

## 2024-06-22 DIAGNOSIS — Z6838 Body mass index (BMI) 38.0-38.9, adult: Secondary | ICD-10-CM | POA: Diagnosis not present

## 2024-06-22 DIAGNOSIS — I10 Essential (primary) hypertension: Secondary | ICD-10-CM | POA: Diagnosis not present

## 2024-06-22 DIAGNOSIS — H6121 Impacted cerumen, right ear: Secondary | ICD-10-CM | POA: Diagnosis not present

## 2024-07-15 DIAGNOSIS — Z1283 Encounter for screening for malignant neoplasm of skin: Secondary | ICD-10-CM | POA: Diagnosis not present

## 2024-07-15 DIAGNOSIS — D225 Melanocytic nevi of trunk: Secondary | ICD-10-CM | POA: Diagnosis not present

## 2024-07-15 DIAGNOSIS — L853 Xerosis cutis: Secondary | ICD-10-CM | POA: Diagnosis not present

## 2024-07-15 DIAGNOSIS — L578 Other skin changes due to chronic exposure to nonionizing radiation: Secondary | ICD-10-CM | POA: Diagnosis not present

## 2024-07-15 DIAGNOSIS — L821 Other seborrheic keratosis: Secondary | ICD-10-CM | POA: Diagnosis not present

## 2024-07-20 ENCOUNTER — Ambulatory Visit
Admission: RE | Admit: 2024-07-20 | Discharge: 2024-07-20 | Disposition: A | Source: Ambulatory Visit | Attending: Family Medicine | Admitting: Family Medicine

## 2024-07-20 DIAGNOSIS — Z1231 Encounter for screening mammogram for malignant neoplasm of breast: Secondary | ICD-10-CM
